# Patient Record
Sex: Female | Born: 1995 | Race: Black or African American | Hispanic: No | Marital: Single | Smoking: Never smoker
Health system: Southern US, Community
[De-identification: ages and names within clinical notes are randomized; demographics above are authoritative.]

## PROBLEM LIST (undated history)

## (undated) DIAGNOSIS — D649 Anemia, unspecified: Secondary | ICD-10-CM

## (undated) DIAGNOSIS — D573 Sickle-cell trait: Secondary | ICD-10-CM

## (undated) HISTORY — PX: NO PAST SURGERIES: SHX2092

---

## 2019-01-14 NOTE — L&D Delivery Note (Signed)
OB/GYN Faculty Practice Delivery Note  Brenda Mayo is a 24 y.o. [name] s/p SVD at [redacted]w[redacted]d. She was admitted for IOL for late term.   ROM: 18h 70m with clear fluid GBS Status:  Negative/-- (07/12 0954) Maximum Maternal Temperature: n/a  Labor Progress: . Initial SVE: 0/Thick/-3. She received Cytotec and was transitioned to Pitocin. She progressed to complete over the course of several hours.   Delivery Date/Time: 8/11 at 15:37  Delivery: Called to room and patient was complete and pushing. Head delivered ROT. No nuchal cord present. Shoulder and body delivered in usual fashion. Infant with spontaneous cry, placed on mother's abdomen, dried and stimulated. Cord clamped x 2 after 1-minute delay, and cut by FOB. Cord blood drawn. Placenta delivered spontaneously with gentle cord traction. Fundus firm with massage and Pitocin. Postpartum Liletta IUD inserted, see separate procedure note for details. Labia, perineum, vagina, and cervix inspected inspected with first degree laceration that was repaired in routine fashion with 3-0 vicryl with good hemostasis.  .  Baby Weight: pending  Placenta: Sent to L&D Complications: none Lacerations: first degree, repaired EBL: 300 mL Analgesia: Epidural    Infant:  APGAR (1 MIN): 8   APGAR (5 MINS): 9   APGAR (10 MINS):     Casper Harrison, MD [facility] Family Medicine Fellow, [facility] for [facility], [facility] 08/24/2019, 4:05 PM

## 2019-02-16 ENCOUNTER — Other Ambulatory Visit: Payer: Self-pay

## 2019-02-16 ENCOUNTER — Inpatient Hospital Stay (HOSPITAL_COMMUNITY)
Admission: EM | Admit: 2019-02-16 | Discharge: 2019-02-16 | Disposition: A | Discharge status: Home / Self Care | Payer: Medicaid Other | Attending: Obstetrics and Gynecology | Admitting: Obstetrics and Gynecology

## 2019-02-16 ENCOUNTER — Inpatient Hospital Stay (HOSPITAL_COMMUNITY): Payer: Medicaid Other

## 2019-02-16 ENCOUNTER — Encounter (HOSPITAL_COMMUNITY): Payer: Self-pay | Admitting: Obstetrics and Gynecology

## 2019-02-16 DIAGNOSIS — O26891 Other specified pregnancy related conditions, first trimester: Secondary | ICD-10-CM

## 2019-02-16 DIAGNOSIS — R1031 Right lower quadrant pain: Secondary | ICD-10-CM | POA: Diagnosis present

## 2019-02-16 DIAGNOSIS — Z880 Allergy status to penicillin: Secondary | ICD-10-CM | POA: Diagnosis not present

## 2019-02-16 DIAGNOSIS — R109 Unspecified abdominal pain: Secondary | ICD-10-CM

## 2019-02-16 DIAGNOSIS — O26899 Other specified pregnancy related conditions, unspecified trimester: Secondary | ICD-10-CM

## 2019-02-16 DIAGNOSIS — Z3A12 12 weeks gestation of pregnancy: Secondary | ICD-10-CM | POA: Diagnosis not present

## 2019-02-16 DIAGNOSIS — Z3491 Encounter for supervision of normal pregnancy, unspecified, first trimester: Secondary | ICD-10-CM

## 2019-02-16 HISTORY — DX: Sickle-cell trait: D57.3

## 2019-02-16 HISTORY — DX: Anemia, unspecified: D64.9

## 2019-02-16 LAB — URINALYSIS, ROUTINE W REFLEX MICROSCOPIC
Bilirubin Urine: NEGATIVE
Glucose, UA: NEGATIVE mg/dL
Hgb urine dipstick: NEGATIVE
Ketones, ur: NEGATIVE mg/dL
Leukocytes,Ua: NEGATIVE
Nitrite: NEGATIVE
Protein, ur: NEGATIVE mg/dL
Specific Gravity, Urine: 1.017 (ref 1.005–1.030)
pH: 6 (ref 5.0–8.0)

## 2019-02-16 LAB — POCT PREGNANCY, URINE: Preg Test, Ur: POSITIVE — AB

## 2019-02-16 NOTE — Discharge Instructions (Signed)
Prenatal Care Providers           Center for [facility] @ [facility]   Phone: [phone]  Center for [facility] @ [facility]   Phone: [phone]  [facility] @[place]       Phone: [phone]            Center for [facility] @ [place]     Phone: [phone]          Center for [facility] @ [place]   Phone: [phone]  Center for [facility] @ [facility]  Phone: [phone]  Center for [facility] @ [facility] Phone: [phone]     [facility]  Phone: [phone]  [facility]  Phone: [phone]  [phone] OB/GYN Phone: [phone]  [facility] Phone: [phone]  [facility] Phone: [phone]  [facility] Phone: [phone]  [facility]  Phone: [phone]    Safe Medications in Pregnancy   Acne: Benzoyl Peroxide Salicylic Acid  Backache/Headache: Tylenol: 2 regular strength every 4 hours OR              2 Extra strength every 6 hours  Colds/Coughs/Allergies: Benadryl (alcohol free) 25 mg every 6 hours as needed Breath right strips Claritin Cepacol throat lozenges Chloraseptic throat spray Cold-Eeze- up to three times per day Cough drops, alcohol free Flonase (by prescription only) Guaifenesin Mucinex Robitussin DM (plain only, alcohol free) Saline nasal spray/drops Sudafed (pseudoephedrine) & Actifed ** use only after [redacted] weeks gestation and if you do not have high blood pressure Tylenol Vicks Vaporub Zinc lozenges Zyrtec   Constipation: Colace Ducolax suppositories Fleet enema Glycerin suppositories Metamucil Milk of magnesia Miralax Senokot Smooth move tea  Diarrhea: Kaopectate Imodium A-D  *NO pepto Bismol  Hemorrhoids: Anusol Anusol HC Preparation H Tucks  Indigestion: Tums Maalox Mylanta Zantac  Pepcid  Insomnia: Benadryl (alcohol free)  25mg  every 6 hours as needed Tylenol PM Unisom, no Gelcaps  Leg Cramps: Tums MagGel  Nausea/Vomiting:  Bonine Dramamine Emetrol Ginger extract Sea bands Meclizine  Nausea medication to take during pregnancy:  Unisom (doxylamine succinate 25 mg tablets) Take one tablet daily at bedtime. If symptoms are not adequately controlled, the dose can be increased to a maximum recommended dose of two tablets daily (1/2 tablet in the morning, 1/2 tablet mid-afternoon and one at bedtime). Vitamin B6 100mg  tablets. Take one tablet twice a day (up to 200 mg per day).  Skin Rashes: Aveeno products Benadryl cream or 25mg  every 6 hours as needed Calamine Lotion 1% cortisone cream  Yeast infection: Gyne-lotrimin 7 Monistat 7  Gum/tooth pain: Anbesol  **If taking multiple medications, please check labels to avoid duplicating the same active ingredients **take medication as directed on the label ** Do not exceed 4000 mg of tylenol in 24 hours **Do not take medications that contain aspirin or ibuprofen     Prenatal Care Prenatal care is health care during pregnancy. It helps you and your unborn baby (fetus) stay as healthy as possible. Prenatal care may be provided by a midwife, a family practice health care provider, or a childbirth and pregnancy specialist (obstetrician). How does this affect me? During pregnancy, you will be closely monitored for any new conditions that might develop. To lower your risk of pregnancy complications, you and your health care provider will talk about any underlying conditions you have. How does this affect my baby? Early and consistent prenatal care increases the chance that your  baby will be healthy during pregnancy. Prenatal care lowers the risk that your baby will be:  Born early (prematurely).  Smaller than expected at birth (small for gestational age). What can I expect at the first prenatal care visit? Your first prenatal care visit will likely be  the longest. You should schedule your first prenatal care visit as soon as you know that you are pregnant. Your first visit is a good time to talk about any questions or concerns you have about pregnancy. At your visit, you and your health care provider will talk about:  Your medical history, including: ? Any past pregnancies. ? Your family's medical history. ? The baby's father's medical history. ? Any long-term (chronic) health conditions you have and how you manage them. ? Any surgeries or procedures you have had. ? Any current over-the-counter or prescription medicines, herbs, or supplements you are taking.  Other factors that could pose a risk to your baby, including:  Your home setting and your stress levels, including: ? Exposure to abuse or violence. ? Household financial strain. ? Mental health conditions you have.  Your daily health habits, including diet and exercise. Your health care provider will also:  Measure your weight, height, and blood pressure.  Do a physical exam, including a pelvic and breast exam.  Perform blood tests and urine tests to check for: ? Urinary tract infection. ? Sexually transmitted infections (STIs). ? Low iron levels in your blood (anemia). ? Blood type and certain proteins on red blood cells (Rh antibodies). ? Infections and immunity to viruses, such as hepatitis B and rubella. ? HIV (human immunodeficiency virus).  Do an ultrasound to confirm your baby's growth and development and to help predict your estimated due date (EDD). This ultrasound is done with a probe that is inserted into the vagina (transvaginal ultrasound).  Discuss your options for genetic screening.  Give you information about how to keep yourself and your baby healthy, including: ? Nutrition and taking vitamins. ? Physical activity. ? How to manage pregnancy symptoms such as nausea and vomiting (morning sickness). ? Infections and substances that may be harmful to your  baby and how to avoid them. ? Food safety. ? Dental care. ? Working. ? Travel. ? Warning signs to watch for and when to call your health care provider. How often will I have prenatal care visits? After your first prenatal care visit, you will have regular visits throughout your pregnancy. The visit schedule is often as follows:  Up to week 28 of pregnancy: once every 4 weeks.  28-36 weeks: once every 2 weeks.  After 36 weeks: every week until delivery. Some women may have visits more or less often depending on any underlying health conditions and the health of the baby. Keep all follow-up and prenatal care visits as told by your health care provider. This is important. What happens during routine prenatal care visits? Your health care provider will:  Measure your weight and blood pressure.  Check for fetal heart sounds.  Measure the height of your uterus in your abdomen (fundal height). This may be measured starting around week 20 of pregnancy.  Check the position of your baby inside your uterus.  Ask questions about your diet, sleeping patterns, and whether you can feel the baby move.  Review warning signs to watch for and signs of labor.  Ask about any pregnancy symptoms you are having and how you are dealing with them. Symptoms may include: ? Headaches. ? Nausea and vomiting. ?  Vaginal discharge. ? Swelling. ? Fatigue. ? Constipation. ? Any discomfort, including back or pelvic pain. Make a list of questions to ask your health care provider at your routine visits. What tests might I have during prenatal care visits? You may have blood, urine, and imaging tests throughout your pregnancy, such as:  Urine tests to check for glucose, protein, or signs of infection.  Glucose tests to check for a form of diabetes that can develop during pregnancy (gestational diabetes mellitus). This is usually done around week 24 of pregnancy.  An ultrasound to check your baby's growth and  development and to check for birth defects. This is usually done around week 20 of pregnancy.  A test to check for group B strep (GBS) infection. This is usually done around week 36 of pregnancy.  Genetic testing. This may include blood or imaging tests, such as an ultrasound. Some genetic tests are done during the first trimester and some are done during the second trimester. What else can I expect during prenatal care visits? Your health care provider may recommend getting certain vaccines during pregnancy. These may include:  A yearly flu shot (annual influenza vaccine). This is especially important if you will be pregnant during flu season.  Tdap (tetanus, diphtheria, pertussis) vaccine. Getting this vaccine during pregnancy can protect your baby from whooping cough (pertussis) after birth. This vaccine may be recommended between weeks 27 and 36 of pregnancy. Later in your pregnancy, your health care provider may give you information about:  Childbirth and breastfeeding classes.  Choosing a health care provider for your baby.  Umbilical cord banking.  Breastfeeding.  Birth control after your baby is born.  The hospital labor and delivery unit and how to tour it.  Registering at the hospital before you go into labor. Where to find more information  Office on Women's Health: TravelLesson.ca  American Pregnancy Association: americanpregnancy.org  March of Dimes: marchofdimes.org Summary  Prenatal care helps you and your baby stay as healthy as possible during pregnancy.  Your first prenatal care visit will most likely be the longest.  You will have visits and tests throughout your pregnancy to monitor your health and your baby's health.  Bring a list of questions to your visits to ask your health care provider.  Make sure to keep all follow-up and prenatal care visits with your health care provider. This information is not intended to replace advice given to you by your  health care provider. Make sure you discuss any questions you have with your health care provider. Document Revised: 04/21/2018 Document Reviewed: 12/29/2016 Elsevier Patient Education  2020 [employer].

## 2019-02-16 NOTE — ED Notes (Signed)
Patient transferred to MAU by EMT. Ala Dach, [place] assessed patient. Report given to MAU charge.

## 2019-02-16 NOTE — MAU Note (Signed)
Pt presents to MAU with c/o abdominal cramping that started last night. She denies VB. She has had +HPT on 12/15/2018. LMP-11/23/2018

## 2019-02-16 NOTE — ED Provider Notes (Signed)
MSE was initiated and I personally evaluated the patient and placed orders (if any) at  2:00PM on February 16, 2019.  Brenda Mayo is a 24 y.o. female who is approximately [redacted] weeks pregnant, history of 1 prior miscarriage, who presents today with lower abdominal cramping that started last night.  She reports cramping is mild-moderate in intensity.  She denies any associated vaginal bleeding or leakage of fluids.  She had a positive home pregnancy test on 12/15/2018, and last menstrual period was on 11/23/2018.  She is waiting on insurance and has not yet seen an OB/GYN provider during this pregnancy, she has not had an ultrasound yet to confirm intrauterine pregnancy.  Patient denies any other medical complaints.  The patient appears stable so that the remainder of the MSE may be completed by another provider.  I called and notified provider in the MAU and nursing staff will transport patient to the MAU for further evaluation.   Dartha Lodge, PA-C 02/16/19 1540    Gerhard Munch, MD 02/16/19 [phone]

## 2019-02-16 NOTE — H&P (Signed)
Chief Complaint: Abdominal Pain   First Provider Initiated Contact with Patient 02/16/19 1452     SUBJECTIVE HPI: Brenda Mayo is a 24 y.o. [name] at [redacted]w[redacted]d who presents to maternity Admissions reporting cramping in her right lower abdominal/pelvic area. The discomfort started yesterday at around 8:30 pm after she bent over to pick up her dog. She immediately felt the pain which resolved a few hours later after resting. She has not had any discomfort today.  Her last day of last menstrual period was on Nov 10. Her first positive pregnancy test was on Dec 8 and the second on Dec 15. Does not follow an outpatient provider and would like to get some recommendations as to where she can establish care. She had a  miscarriage ~3 years ago when she was [redacted] weeks pregnant. In that instance the event started with a similar abdominal discomfort that progressed to intense, generalized abdominal pain.   Location: Right lower abdominal/pelvic Quality: Tension Severity: 6/10 on pain scale Duration: 3 hrs  Timing: 2/2 8:30 pm Modifying factors: Rest made it better Associated signs and symptoms: None  Past Medical History:  Diagnosis Date  . Anemia   . Sickle cell trait (HCC)    OB History  Gravida Para Term Preterm AB Living  2       1    SAB TAB Ectopic Multiple Live Births  1            # Outcome Date GA Lbr Len/2nd Weight Sex Delivery Anes PTL Lv  2 Current           1 SAB 2017           Past Surgical History:  Procedure Laterality Date  . NO PAST SURGERIES     Social History   Socioeconomic History  . Marital status: Single    Spouse name: Not on file  . Number of children: Not on file  . Years of education: Not on file  . Highest education level: Not on file  Occupational History  . Not on file  Tobacco Use  . Smoking status: Never Smoker  . Smokeless tobacco: Never Used  Substance and Sexual Activity  . Alcohol use: Never  . Drug use: Never  . Sexual activity: Yes  Other  Topics Concern  . Not on file  Social History Narrative  . Not on file   Social Determinants of Health   Financial Resource Strain:   . Difficulty of Paying Living Expenses: Not on file  Food Insecurity:   . Worried About Programme researcher, broadcasting/film/video in the Last Year: Not on file  . Ran Out of Food in the Last Year: Not on file  Transportation Needs:   . Lack of Transportation (Medical): Not on file  . Lack of Transportation (Non-Medical): Not on file  Physical Activity:   . Days of Exercise per Week: Not on file  . Minutes of Exercise per Session: Not on file  Stress:   . Feeling of Stress : Not on file  Social Connections:   . Frequency of Communication with Friends and Family: Not on file  . Frequency of Social Gatherings with Friends and Family: Not on file  . Attends Religious Services: Not on file  . Active Member of Clubs or Organizations: Not on file  . Attends Banker Meetings: Not on file  . Marital Status: Not on file  Intimate Partner Violence:   . Fear of Current or Ex-Partner: Not on  file  . Emotionally Abused: Not on file  . Physically Abused: Not on file  . Sexually Abused: Not on file   History reviewed. No pertinent family history. No current facility-administered medications on file prior to encounter.   Current Outpatient Medications on File Prior to Encounter  Medication Sig Dispense Refill  . prenatal vitamin w/FE, FA (PRENATAL 1 + 1) 27-1 MG TABS tablet Take 1 tablet by mouth daily at 12 noon.     Allergies  Allergen Reactions  . Penicillins     Reports as child, does not know the reaction type.     I have reviewed patient's Past Medical Hx, Surgical Hx, Family Hx, Social Hx, medications and allergies.   Review of Systems  Constitutional: Negative for fever.  Gastrointestinal: Negative for diarrhea, nausea and vomiting.  Genitourinary: Negative for difficulty urinating, dyspareunia, dysuria, frequency, hematuria and vaginal discharge.     OBJECTIVE Patient Vitals for the past 24 hrs:  BP Temp Temp src Pulse Resp SpO2 Height Weight  02/16/19 1418 -- -- -- -- -- -- 5\' 10"  (1.778 m) 71.2 kg  02/16/19 1416 135/74 98.6 F (37 C) Oral 82 18 100 % -- --  02/16/19 1352 122/85 98 F (36.7 C) -- 99 13 100 % -- --  02/16/19 1345 122/85 98 F (36.7 C) Oral 99 13 100 % -- --   Constitutional: Well-developed, well-nourished female in no acute distress.  Cardiovascular: normal rate & rhythm, no murmur Respiratory: normal rate and effort. Lung sounds clear throughout GI: Abd soft, non-tender, Pos BS x 4. No guarding or rebound tenderness MS: Extremities nontender, no edema, normal ROM Neurologic: Alert and oriented x 4.    LAB RESULTS Results for orders placed or performed during the hospital encounter of 02/16/19 (from the past 24 hour(s))  Pregnancy, urine POC     Status: Abnormal   Collection Time: 02/16/19  2:15 PM  Result Value Ref Range   Preg Test, Ur POSITIVE (A) NEGATIVE  Urinalysis, Routine w reflex microscopic     Status: Abnormal   Collection Time: 02/16/19  2:20 PM  Result Value Ref Range   Color, Urine YELLOW YELLOW   APPearance HAZY (A) CLEAR   Specific Gravity, Urine 1.017 1.005 - 1.030   pH 6.0 5.0 - 8.0   Glucose, UA NEGATIVE NEGATIVE mg/dL   Hgb urine dipstick NEGATIVE NEGATIVE   Bilirubin Urine NEGATIVE NEGATIVE   Ketones, ur NEGATIVE NEGATIVE mg/dL   Protein, ur NEGATIVE NEGATIVE mg/dL   Nitrite NEGATIVE NEGATIVE   Leukocytes,Ua NEGATIVE NEGATIVE    IMAGING No results found.  MAU COURSE Orders Placed This Encounter  Procedures  . US OB Comp Less 14 Wks  . Urinalysis, Routine w reflex microscopic  . Pregnancy, urine POC   No orders of the defined types were placed in this encounter.   MDM  ASSESSMENT 24 yo P2G0010 who present for evaluation of a now resolved right lower abdominal/pelvic cramping after lifting a heavy object. Given that the pain resolved last night and she has no other  worrisome symptoms such vaginal discharge, vaginal bleeding, fever, dyspareunia, or dysuria, we think is safe for the patient to be discharged.  1. Normal IUP (intrauterine pregnancy) on prenatal ultrasound, first trimester   2. [redacted] weeks gestation of pregnancy   3. Abdominal cramping affecting pregnancy     PLAN Discharge home in stable condition. Please return to the ED if she experiences vaginal bleeding, fever or intense abdominal pain that is not relieved  with rest.     Manuela Neptune, Medical Student 02/16/2019  3:17 PM   I confirm that I have verified the information documented in the medical student's note and that I have also personally performed the history, physical exam and all medical decision making activities of this service and have verified that all service and findings are accurately documented in this student's note.   RN unable to doppler FHTs. Ultrasound ordered through MAU & shows live IUP with EDD c/w LMP.  Patient asymptomatic at this time.  U/a negative for infection.  Given list of ob/gyn providers - pt currently lives in [place] but will be moving to [place] this summer. Discussed [facility] where she would like to get prenatal care.  Reviewed reasons to return to MAU.   Judeth Horn, NP 02/16/2019 5:08 PM

## 2019-03-16 ENCOUNTER — Encounter: Payer: Medicaid Other | Admitting: Family Medicine

## 2019-04-11 ENCOUNTER — Encounter: Payer: Medicaid Other | Admitting: Obstetrics & Gynecology

## 2019-04-19 ENCOUNTER — Ambulatory Visit (INDEPENDENT_AMBULATORY_CARE_PROVIDER_SITE_OTHER): Payer: Medicaid Other | Admitting: Advanced Practice Midwife

## 2019-04-19 ENCOUNTER — Encounter: Payer: Self-pay | Admitting: Advanced Practice Midwife

## 2019-04-19 ENCOUNTER — Other Ambulatory Visit (HOSPITAL_COMMUNITY)
Admission: RE | Admit: 2019-04-19 | Discharge: 2019-04-19 | Disposition: A | Discharge status: Home / Self Care | Payer: Medicaid Other | Source: Ambulatory Visit | Attending: Family Medicine | Admitting: Family Medicine

## 2019-04-19 ENCOUNTER — Other Ambulatory Visit: Payer: Self-pay

## 2019-04-19 VITALS — BP 112/67 | HR 100 | Wt 194.1 lb

## 2019-04-19 DIAGNOSIS — Z3480 Encounter for supervision of other normal pregnancy, unspecified trimester: Secondary | ICD-10-CM

## 2019-04-19 DIAGNOSIS — D573 Sickle-cell trait: Secondary | ICD-10-CM

## 2019-04-19 NOTE — Progress Notes (Signed)
hep

## 2019-04-19 NOTE — Progress Notes (Signed)
  Subjective:    Brenda Mayo is a [name] [redacted]w[redacted]d being seen today for her first obstetrical visit.  Her obstetrical history is significant for sickle cell trait. Patient does intend to breast feed. Pregnancy history fully reviewed.  Patient reports no complaints.  Has had episodes of swelling which resolved quickly.  Though she had preeclampsia so bought a cuff.  FOB has not been tested "but doesn't have it in his family".  Explained we recommend he be tested.  Vitals:   04/19/19 0802  BP: 112/67  Pulse: 100  Weight: 194 lb 1.9 oz (88.1 kg)    HISTORY: OB History  Gravida Para Term Preterm AB Living  2       1    SAB TAB Ectopic Multiple Live Births  1            # Outcome Date GA Lbr Len/2nd Weight Sex Delivery Anes PTL Lv  2 Current           1 SAB 2017           Past Medical History:  Diagnosis Date  . Anemia   . Sickle cell trait [facility])    Past Surgical History:  Procedure Laterality Date  . NO PAST SURGERIES     Family History  Problem Relation Age of Onset  . Sickle cell trait Mother      Exam    Uterus:     Pelvic Exam:    Perineum: Normal Perineum   Vulva: Bartholin's, Urethra, Skene's normal   Vagina:  normal mucosa, normal discharge   pH:    Cervix: no bleeding following Pap and no cervical motion tenderness   Adnexa: normal adnexa and no mass, fullness, tenderness   Bony Pelvis: gynecoid  System: Breast:  normal appearance, no masses or tenderness   Skin: normal coloration and turgor, no rashes    Neurologic: oriented, grossly non-focal   Extremities: normal strength, tone, and muscle mass   HEENT neck supple with midline trachea   Mouth/Teeth mucous membranes moist, pharynx normal without lesions   Neck supple and no masses   Cardiovascular: regular rate and rhythm   Respiratory:  appears well, vitals normal, no respiratory distress, acyanotic, normal RR, ear and throat exam is normal, neck free of mass or lymphadenopathy, chest clear, no  wheezing, crepitations, rhonchi, normal symmetric air entry   Abdomen: soft, non-tender; bowel sounds normal; no masses,  no organomegaly   Urinary: urethral meatus normal      Assessment:    Pregnancy: [name] Patient Active Problem List   Diagnosis Date Noted  . Supervision of other normal pregnancy, antepartum 04/19/2019        Plan:     Problem list reviewed and updated.  Follow up in 6 weeks for Glucola 50% of 30 min visit spent on counseling and coordination of care.   Initial labs drawn. Continue prenatal vitamins. Discussed and offered genetic screening options, including Quad screen/AFP, NIPS testing, and option to decline testing. Benefits/risks/alternatives reviewed. Pt aware that anatomy US is form of genetic screening with lower accuracy in detecting trisomies than blood work.  Pt chooses genetic screening today. NIPS: ordered. Ultrasound discussed; fetal anatomic survey: ordered. Problem list reviewed and updated. The nature of [facility] with multiple MDs and other Advanced Practice Providers was explained to patient; also emphasized that residents, students are part of our team. Routine obstetric precautions reviewed.    Wynelle Bourgeois 04/19/2019

## 2019-04-19 NOTE — Patient Instructions (Signed)

## 2019-04-20 LAB — OBSTETRIC PANEL, INCLUDING HIV
Antibody Screen: NEGATIVE
Basophils Absolute: 0.1 10*3/uL (ref 0.0–0.2)
Basos: 1 %
EOS (ABSOLUTE): 0.1 10*3/uL (ref 0.0–0.4)
Eos: 1 %
HIV Screen 4th Generation wRfx: NONREACTIVE
Hematocrit: 36.2 % (ref 34.0–46.6)
Hemoglobin: 11.7 g/dL (ref 11.1–15.9)
Hepatitis B Surface Ag: NEGATIVE
Immature Grans (Abs): 0.3 10*3/uL — ABNORMAL HIGH (ref 0.0–0.1)
Immature Granulocytes: 2 %
Lymphocytes Absolute: 2.3 10*3/uL (ref 0.7–3.1)
Lymphs: 17 %
MCH: 31.1 pg (ref 26.6–33.0)
MCHC: 32.3 g/dL (ref 31.5–35.7)
MCV: 96 fL (ref 79–97)
Monocytes Absolute: 1 10*3/uL — ABNORMAL HIGH (ref 0.1–0.9)
Monocytes: 8 %
Neutrophils Absolute: 9.6 10*3/uL — ABNORMAL HIGH (ref 1.4–7.0)
Neutrophils: 71 %
Platelets: 214 10*3/uL (ref 150–450)
RBC: 3.76 x10E6/uL — ABNORMAL LOW (ref 3.77–5.28)
RDW: 11.8 % (ref 11.7–15.4)
RPR Ser Ql: NONREACTIVE
Rh Factor: POSITIVE
Rubella Antibodies, IGG: 7.44 index (ref >0.99)
WBC: 13.3 10*3/uL — ABNORMAL HIGH (ref 3.4–10.8)

## 2019-04-20 LAB — HEPATITIS C ANTIBODY: Hep C Virus Ab: <0.1 s/co ratio (ref 0.0–0.9)

## 2019-04-21 LAB — CYTOLOGY - PAP
Chlamydia: NEGATIVE
Comment: NEGATIVE
Comment: NORMAL
Diagnosis: NEGATIVE
Neisseria Gonorrhea: NEGATIVE

## 2019-04-21 LAB — URINE CULTURE, OB REFLEX

## 2019-04-21 LAB — CULTURE, OB URINE

## 2019-04-22 LAB — HEMOGLOBPATHY+FER W/A THAL RFX
Ferritin: 23 ng/mL (ref 15–150)
Hematocrit: 36.2 % (ref 34.0–46.6)
Hemoglobin: 11.6 g/dL (ref 11.1–15.9)
Hgb A2: 3.2 % (ref 1.8–3.2)
Hgb A: 55.3 % — ABNORMAL LOW (ref 96.4–98.8)
Hgb F: 0.5 % (ref 0.0–2.0)
Hgb S: 41 % — ABNORMAL HIGH
MCH: 31.1 pg (ref 26.6–33.0)
MCHC: 32 g/dL (ref 31.5–35.7)
MCV: 97 fL (ref 79–97)
Platelets: 213 10*3/uL (ref 150–450)
RBC: 3.73 x10E6/uL — ABNORMAL LOW (ref 3.77–5.28)
RDW: 11.8 % (ref 11.7–15.4)
WBC: 12.8 10*3/uL — ABNORMAL HIGH (ref 3.4–10.8)

## 2019-04-22 LAB — HGB SOLUBILITY: Hgb Solubility: POSITIVE — AB

## 2019-04-25 ENCOUNTER — Other Ambulatory Visit: Payer: Self-pay

## 2019-04-25 NOTE — Progress Notes (Signed)
Patient made aware of Panorama results. Patient also made aware that fetal sex is female. [name] RN

## 2019-04-26 ENCOUNTER — Encounter: Payer: Self-pay | Admitting: Advanced Practice Midwife

## 2019-04-26 DIAGNOSIS — D573 Sickle-cell trait: Secondary | ICD-10-CM | POA: Insufficient documentation

## 2019-04-26 DIAGNOSIS — O99019 Anemia complicating pregnancy, unspecified trimester: Secondary | ICD-10-CM | POA: Insufficient documentation

## 2019-04-27 ENCOUNTER — Other Ambulatory Visit: Payer: Self-pay

## 2019-04-27 ENCOUNTER — Telehealth: Payer: Self-pay

## 2019-04-27 ENCOUNTER — Ambulatory Visit (HOSPITAL_COMMUNITY)
Admission: RE | Admit: 2019-04-27 | Discharge: 2019-04-27 | Disposition: A | Discharge status: Home / Self Care | Payer: Medicaid Other | Source: Ambulatory Visit | Attending: Advanced Practice Midwife | Admitting: Advanced Practice Midwife

## 2019-04-27 ENCOUNTER — Other Ambulatory Visit (HOSPITAL_COMMUNITY): Payer: Self-pay | Admitting: *Deleted

## 2019-04-27 DIAGNOSIS — Z3A23 23 weeks gestation of pregnancy: Secondary | ICD-10-CM | POA: Diagnosis not present

## 2019-04-27 DIAGNOSIS — Z3480 Encounter for supervision of other normal pregnancy, unspecified trimester: Secondary | ICD-10-CM

## 2019-04-27 DIAGNOSIS — Z363 Encounter for antenatal screening for malformations: Secondary | ICD-10-CM

## 2019-04-27 DIAGNOSIS — Z362 Encounter for other antenatal screening follow-up: Secondary | ICD-10-CM

## 2019-04-27 NOTE — Telephone Encounter (Signed)
Called patient and offered to let her come in for AFP only. Patient states she is unable to come in this week (she is 23.1 wks today) for the blood draw. Patient declines AFP. [name] RN

## 2019-05-02 ENCOUNTER — Other Ambulatory Visit: Payer: Self-pay

## 2019-05-27 ENCOUNTER — Other Ambulatory Visit: Payer: Self-pay

## 2019-05-27 ENCOUNTER — Ambulatory Visit (HOSPITAL_COMMUNITY): Payer: Medicaid Other | Attending: Obstetrics and Gynecology

## 2019-05-27 DIAGNOSIS — Z362 Encounter for other antenatal screening follow-up: Secondary | ICD-10-CM | POA: Insufficient documentation

## 2019-05-27 DIAGNOSIS — Z3A27 27 weeks gestation of pregnancy: Secondary | ICD-10-CM

## 2019-05-27 DIAGNOSIS — Z363 Encounter for antenatal screening for malformations: Secondary | ICD-10-CM | POA: Diagnosis not present

## 2019-05-31 ENCOUNTER — Ambulatory Visit (INDEPENDENT_AMBULATORY_CARE_PROVIDER_SITE_OTHER): Payer: Medicaid Other | Admitting: Advanced Practice Midwife

## 2019-05-31 ENCOUNTER — Encounter: Payer: Self-pay | Admitting: Advanced Practice Midwife

## 2019-05-31 ENCOUNTER — Other Ambulatory Visit: Payer: Self-pay

## 2019-05-31 DIAGNOSIS — Z3480 Encounter for supervision of other normal pregnancy, unspecified trimester: Secondary | ICD-10-CM

## 2019-05-31 NOTE — Patient Instructions (Signed)
Glucose Tolerance Test During Pregnancy Why am I having this test? The glucose tolerance test (GTT) is done to check how your body processes sugar (glucose). This is one of several tests used to diagnose diabetes that develops during pregnancy (gestational diabetes mellitus). Gestational diabetes is a temporary form of diabetes that some women develop during pregnancy. It usually occurs during the second trimester of pregnancy and goes away after delivery. Testing (screening) for gestational diabetes usually occurs between 24 and 28 weeks of pregnancy. You may have the GTT test after having a 1-hour glucose screening test if the results from that test indicate that you may have gestational diabetes. You may also have this test if:  You have a history of gestational diabetes.  You have a history of giving birth to very large babies or have experienced repeated fetal loss (stillbirth).  You have signs and symptoms of diabetes, such as: ? Changes in your vision. ? Tingling or numbness in your hands or feet. ? Changes in hunger, thirst, and urination that are not otherwise explained by your pregnancy. What is being tested? This test measures the amount of glucose in your blood at different times during a period of 3 hours. This indicates how well your body is able to process glucose. What kind of sample is taken?  Blood samples are required for this test. They are usually collected by inserting a needle into a blood vessel. How do I prepare for this test?  For 3 days before your test, eat normally. Have plenty of carbohydrate-rich foods.  Follow instructions from your health care provider about: ? Eating or drinking restrictions on the day of the test. You may be asked to not eat or drink anything other than water (fast) starting 8-10 hours before the test. ? Changing or stopping your regular medicines. Some medicines may interfere with this test. Tell a health care provider about:  All  medicines you are taking, including vitamins, herbs, eye drops, creams, and over-the-counter medicines.  Any blood disorders you have.  Any surgeries you have had.  Any medical conditions you have. What happens during the test? First, your blood glucose will be measured. This is referred to as your fasting blood glucose, since you fasted before the test. Then, you will drink a glucose solution that contains a certain amount of glucose. Your blood glucose will be measured again 1, 2, and 3 hours after drinking the solution. This test takes about 3 hours to complete. You will need to stay at the testing location during this time. During the testing period:  Do not eat or drink anything other than the glucose solution.  Do not exercise.  Do not use any products that contain nicotine or tobacco, such as cigarettes and e-cigarettes. If you need help stopping, ask your health care provider. The testing procedure may vary among health care providers and hospitals. How are the results reported? Your results will be reported as milligrams of glucose per deciliter of blood (mg/dL) or millimoles per liter (mmol/L). Your health care provider will compare your results to normal ranges that were established after testing a large group of people (reference ranges). Reference ranges may vary among labs and hospitals. For this test, common reference ranges are:  Fasting: less than 95-105 mg/dL (5.3-5.8 mmol/L).  1 hour after drinking glucose: less than 180-190 mg/dL (10.0-10.5 mmol/L).  2 hours after drinking glucose: less than 155-165 mg/dL (8.6-9.2 mmol/L).  3 hours after drinking glucose: 140-145 mg/dL (7.8-8.1 mmol/L). What do the   results mean? Results within reference ranges are considered normal, meaning that your glucose levels are well-controlled. If two or more of your blood glucose levels are high, you may be diagnosed with gestational diabetes. If only one level is high, your health care  provider may suggest repeat testing or other tests to confirm a diagnosis. Talk with your health care provider about what your results mean. Questions to ask your health care provider Ask your health care provider, or the department that is doing the test:  When will my results be ready?  How will I get my results?  What are my treatment options?  What other tests do I need?  What are my next steps? Summary  The glucose tolerance test (GTT) is one of several tests used to diagnose diabetes that develops during pregnancy (gestational diabetes mellitus). Gestational diabetes is a temporary form of diabetes that some women develop during pregnancy.  You may have the GTT test after having a 1-hour glucose screening test if the results from that test indicate that you may have gestational diabetes. You may also have this test if you have any symptoms or risk factors for gestational diabetes.  Talk with your health care provider about what your results mean. This information is not intended to replace advice given to you by your health care provider. Make sure you discuss any questions you have with your health care provider. Document Revised: 04/22/2018 Document Reviewed: 08/11/2016 Elsevier Patient Education  2020 [employer]  

## 2019-05-31 NOTE — Progress Notes (Signed)
Patient ate oatmeal cake this morning and is unable to 28 week lab work. Patient scheduled to return Monday morning fasting for labwork. [name] RN

## 2019-05-31 NOTE — Progress Notes (Signed)
   PRENATAL VISIT NOTE  Subjective:  Brenda Mayo is a 24 y.o. [name] at [redacted]w[redacted]d being seen today for ongoing prenatal care.  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  Patient reports no complaints.  Contractions: Not present. Vag. Bleeding: None.  Movement: Present. Denies leaking of fluid.   The following portions of the patient's history were reviewed and updated as appropriate: allergies, current medications, past family history, past medical history, past social history, past surgical history and problem list.   Objective:   Vitals:   05/31/19 0820  BP: 121/66  Pulse: 97  Weight: 224 lb (101.6 kg)    Fetal Status: Fetal Heart Rate (bpm): 140   Movement: Present     General:  Alert, oriented and cooperative. Patient is in no acute distress.  Skin: Skin is warm and dry. No rash noted.   Cardiovascular: Normal heart rate noted  Respiratory: Normal respiratory effort, no problems with respiration noted  Abdomen: Soft, gravid, appropriate for gestational age.  Pain/Pressure: Absent     Pelvic: Cervical exam deferred        Extremities: Normal range of motion.  Edema: Trace  Mental Status: Normal mood and affect. Normal behavior. Normal judgment and thought content.   Assessment and Plan:  Pregnancy: [name] at [redacted]w[redacted]d 1. Supervision of other normal pregnancy, antepartum      Questions answered re:  glucola testing, round ligament pain, what contractions feel like  Preterm labor symptoms and general obstetric precautions including but not limited to vaginal bleeding, contractions, leaking of fluid and fetal movement were reviewed in detail with the patient. Please refer to After Visit Summary for other counseling recommendations.   Return in about 4 weeks (around 06/28/2019) for [facility]  Future Appointments  Date Time Provider Department Center  06/06/2019  8:30 AM [facility] NURSE [facility] None  06/27/2019  8:15 AM Willodean Rosenthal, MD [facility] None    Wynelle Bourgeois, CNM

## 2019-06-06 ENCOUNTER — Other Ambulatory Visit: Payer: Self-pay

## 2019-06-06 ENCOUNTER — Ambulatory Visit: Payer: Medicaid Other

## 2019-06-06 DIAGNOSIS — Z3480 Encounter for supervision of other normal pregnancy, unspecified trimester: Secondary | ICD-10-CM

## 2019-06-06 NOTE — Progress Notes (Signed)
Patient sent to lab for 28 weeks labs. [name] RN

## 2019-06-07 LAB — CBC
Hematocrit: 34 % (ref 34.0–46.6)
Hemoglobin: 10.9 g/dL — ABNORMAL LOW (ref 11.1–15.9)
MCH: 29.5 pg (ref 26.6–33.0)
MCHC: 32.1 g/dL (ref 31.5–35.7)
MCV: 92 fL (ref 79–97)
Platelets: 226 10*3/uL (ref 150–450)
RBC: 3.7 x10E6/uL — ABNORMAL LOW (ref 3.77–5.28)
RDW: 12.6 % (ref 11.7–15.4)
WBC: 13.8 10*3/uL — ABNORMAL HIGH (ref 3.4–10.8)

## 2019-06-07 LAB — GLUCOSE TOLERANCE, 2 HOURS W/ 1HR
Glucose, 1 hour: 102 mg/dL (ref 65–179)
Glucose, 2 hour: 86 mg/dL (ref 65–152)
Glucose, Fasting: 83 mg/dL (ref 65–91)

## 2019-06-07 LAB — HIV ANTIBODY (ROUTINE TESTING W REFLEX): HIV Screen 4th Generation wRfx: NONREACTIVE

## 2019-06-07 LAB — RPR: RPR Ser Ql: NONREACTIVE

## 2019-06-27 ENCOUNTER — Ambulatory Visit (INDEPENDENT_AMBULATORY_CARE_PROVIDER_SITE_OTHER): Payer: Medicaid Other | Admitting: Obstetrics & Gynecology

## 2019-06-27 ENCOUNTER — Other Ambulatory Visit: Payer: Self-pay

## 2019-06-27 VITALS — BP 113/69 | HR 94 | Wt 233.0 lb

## 2019-06-27 DIAGNOSIS — Z3A31 31 weeks gestation of pregnancy: Secondary | ICD-10-CM

## 2019-06-27 DIAGNOSIS — Z3480 Encounter for supervision of other normal pregnancy, unspecified trimester: Secondary | ICD-10-CM

## 2019-06-27 DIAGNOSIS — O99019 Anemia complicating pregnancy, unspecified trimester: Secondary | ICD-10-CM

## 2019-06-27 DIAGNOSIS — D573 Sickle-cell trait: Secondary | ICD-10-CM

## 2019-06-27 NOTE — Patient Instructions (Signed)
Third Trimester of Pregnancy The third trimester is from week 28 through week 40 (months 7 through 9). The third trimester is a time when the unborn baby (fetus) is growing rapidly. At the end of the ninth month, the fetus is about 20 inches in length and weighs 6-10 pounds. Body changes during your third trimester Your body will continue to go through many changes during pregnancy. The changes vary from woman to woman. During the third trimester:  Your weight will continue to increase. You can expect to gain 25-35 pounds (11-16 kg) by the end of the pregnancy.  You may begin to get stretch marks on your hips, abdomen, and breasts.  You may urinate more often because the fetus is moving lower into your pelvis and pressing on your bladder.  You may develop or continue to have heartburn. This is caused by increased hormones that slow down muscles in the digestive tract.  You may develop or continue to have constipation because increased hormones slow digestion and cause the muscles that push waste through your intestines to relax.  You may develop hemorrhoids. These are swollen veins (varicose veins) in the rectum that can itch or be painful.  You may develop swollen, bulging veins (varicose veins) in your legs.  You may have increased body aches in the pelvis, back, or thighs. This is due to weight gain and increased hormones that are relaxing your joints.  You may have changes in your hair. These can include thickening of your hair, rapid growth, and changes in texture. Some women also have hair loss during or after pregnancy, or hair that feels dry or thin. Your hair will most likely return to normal after your baby is born.  Your breasts will continue to grow and they will continue to become tender. A yellow fluid (colostrum) may leak from your breasts. This is the first milk you are producing for your baby.  Your belly button may stick out.  You may notice more swelling in your hands,  face, or ankles.  You may have increased tingling or numbness in your hands, arms, and legs. The skin on your belly may also feel numb.  You may feel short of breath because of your expanding uterus.  You may have more problems sleeping. This can be caused by the size of your belly, increased need to urinate, and an increase in your body's metabolism.  You may notice the fetus "dropping," or moving lower in your abdomen (lightening).  You may have increased vaginal discharge.  You may notice your joints feel loose and you may have pain around your pelvic bone. What to expect at prenatal visits You will have prenatal exams every 2 weeks until week 36. Then you will have weekly prenatal exams. During a routine prenatal visit:  You will be weighed to make sure you and the baby are growing normally.  Your blood pressure will be taken.  Your abdomen will be measured to track your baby's growth.  The fetal heartbeat will be listened to.  Any test results from the previous visit will be discussed.  You may have a cervical check near your due date to see if your cervix has softened or thinned (effaced).  You will be tested for Group B streptococcus. This happens between 35 and 37 weeks. Your health care provider may ask you:  What your birth plan is.  How you are feeling.  If you are feeling the baby move.  If you have had any abnormal   symptoms, such as leaking fluid, bleeding, severe headaches, or abdominal cramping.  If you are using any tobacco products, including cigarettes, chewing tobacco, and electronic cigarettes.  If you have any questions. Other tests or screenings that may be performed during your third trimester include:  Blood tests that check for low iron levels (anemia).  Fetal testing to check the health, activity level, and growth of the fetus. Testing is done if you have certain medical conditions or if there are problems during the pregnancy.  Nonstress test  (NST). This test checks the health of your baby to make sure there are no signs of problems, such as the baby not getting enough oxygen. During this test, a belt is placed around your belly. The baby is made to move, and its heart rate is monitored during movement. What is false labor? False labor is a condition in which you feel small, irregular tightenings of the muscles in the womb (contractions) that usually go away with rest, changing position, or drinking water. These are called Braxton Hicks contractions. Contractions may last for hours, days, or even weeks before true labor sets in. If contractions come at regular intervals, become more frequent, increase in intensity, or become painful, you should see your health care provider. What are the signs of labor?  Abdominal cramps.  Regular contractions that start at 10 minutes apart and become stronger and more frequent with time.  Contractions that start on the top of the uterus and spread down to the lower abdomen and back.  Increased pelvic pressure and dull back pain.  A watery or bloody mucus discharge that comes from the vagina.  Leaking of amniotic fluid. This is also known as your "water breaking." It could be a slow trickle or a gush. Let your health care provider know if it has a color or strange odor. If you have any of these signs, call your health care provider right away, even if it is before your due date. Follow these instructions at home: Medicines  Follow your health care provider's instructions regarding medicine use. Specific medicines may be either safe or unsafe to take during pregnancy.  Take a prenatal vitamin that contains at least 600 micrograms (mcg) of folic acid.  If you develop constipation, try taking a stool softener if your health care provider approves. Eating and drinking   Eat a balanced diet that includes fresh fruits and vegetables, whole grains, good sources of protein such as meat, eggs, or tofu,  and low-fat dairy. Your health care provider will help you determine the amount of weight gain that is right for you.  Avoid raw meat and uncooked cheese. These carry germs that can cause birth defects in the baby.  If you have low calcium intake from food, talk to your health care provider about whether you should take a daily calcium supplement.  Eat four or five small meals rather than three large meals a day.  Limit foods that are high in fat and processed sugars, such as fried and sweet foods.  To prevent constipation: ? Drink enough fluid to keep your urine clear or pale yellow. ? Eat foods that are high in fiber, such as fresh fruits and vegetables, whole grains, and beans. Activity  Exercise only as directed by your health care provider. Most women can continue their usual exercise routine during pregnancy. Try to exercise for 30 minutes at least 5 days a week. Stop exercising if you experience uterine contractions.  Avoid heavy lifting.  Do   not exercise in extreme heat or humidity, or at high altitudes.  Wear low-heel, comfortable shoes.  Practice good posture.  You may continue to have sex unless your health care provider tells you otherwise. Relieving pain and discomfort  Take frequent breaks and rest with your legs elevated if you have leg cramps or low back pain.  Take warm sitz baths to soothe any pain or discomfort caused by hemorrhoids. Use hemorrhoid cream if your health care provider approves.  Wear a good support bra to prevent discomfort from breast tenderness.  If you develop varicose veins: ? Wear support pantyhose or compression stockings as told by your healthcare provider. ? Elevate your feet for 15 minutes, 3-4 times a day. Prenatal care  Write down your questions. Take them to your prenatal visits.  Keep all your prenatal visits as told by your health care provider. This is important. Safety  Wear your seat belt at all times when driving.  Make  a list of emergency phone numbers, including numbers for family, friends, the hospital, and police and fire departments. General instructions  Avoid cat litter boxes and soil used by cats. These carry germs that can cause birth defects in the baby. If you have a cat, ask someone to clean the litter box for you.  Do not travel far distances unless it is absolutely necessary and only with the approval of your health care provider.  Do not use hot tubs, steam rooms, or saunas.  Do not drink alcohol.  Do not use any products that contain nicotine or tobacco, such as cigarettes and e-cigarettes. If you need help quitting, ask your health care provider.  Do not use any medicinal herbs or unprescribed drugs. These chemicals affect the formation and growth of the baby.  Do not douche or use tampons or scented sanitary pads.  Do not cross your legs for long periods of time.  To prepare for the arrival of your baby: ? Take prenatal classes to understand, practice, and ask questions about labor and delivery. ? Make a trial run to the hospital. ? Visit the hospital and tour the maternity area. ? Arrange for maternity or paternity leave through employers. ? Arrange for family and friends to take care of pets while you are in the hospital. ? Purchase a rear-facing car seat and make sure you know how to install it in your car. ? Pack your hospital bag. ? Prepare the baby's nursery. Make sure to remove all pillows and stuffed animals from the baby's crib to prevent suffocation.  Visit your dentist if you have not gone during your pregnancy. Use a soft toothbrush to brush your teeth and be gentle when you floss. Contact a health care provider if:  You are unsure if you are in labor or if your water has broken.  You become dizzy.  You have mild pelvic cramps, pelvic pressure, or nagging pain in your abdominal area.  You have lower back pain.  You have persistent nausea, vomiting, or  diarrhea.  You have an unusual or bad smelling vaginal discharge.  You have pain when you urinate. Get help right away if:  Your water breaks before 37 weeks.  You have regular contractions less than 5 minutes apart before 37 weeks.  You have a fever.  You are leaking fluid from your vagina.  You have spotting or bleeding from your vagina.  You have severe abdominal pain or cramping.  You have rapid weight loss or weight gain.  You have   shortness of breath with chest pain.  You notice sudden or extreme swelling of your face, hands, ankles, feet, or legs.  Your baby makes fewer than 10 movements in 2 hours.  You have severe headaches that do not go away when you take medicine.  You have vision changes. Summary  The third trimester is from week 28 through week 40, months 7 through 9. The third trimester is a time when the unborn baby (fetus) is growing rapidly.  During the third trimester, your discomfort may increase as you and your baby continue to gain weight. You may have abdominal, leg, and back pain, sleeping problems, and an increased need to urinate.  During the third trimester your breasts will keep growing and they will continue to become tender. A yellow fluid (colostrum) may leak from your breasts. This is the first milk you are producing for your baby.  False labor is a condition in which you feel small, irregular tightenings of the muscles in the womb (contractions) that eventually go away. These are called Braxton Hicks contractions. Contractions may last for hours, days, or even weeks before true labor sets in.  Signs of labor can include: abdominal cramps; regular contractions that start at 10 minutes apart and become stronger and more frequent with time; watery or bloody mucus discharge that comes from the vagina; increased pelvic pressure and dull back pain; and leaking of amniotic fluid. This information is not intended to replace advice given to you by your  health care provider. Make sure you discuss any questions you have with your health care provider. Document Revised: 04/22/2018 Document Reviewed: 02/05/2016 Elsevier Patient Education  2020 [employer] Inc.  

## 2019-06-27 NOTE — Progress Notes (Signed)
   PRENATAL VISIT NOTE  Subjective:  Brenda Mayo is a 24 y.o. [name] at [redacted]w[redacted]d being seen today for ongoing prenatal care.  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  Patient reports movement in lower pelvis. .  Contractions: Not present. Vag. Bleeding: None.  Movement: Present. Denies leaking of fluid.   The following portions of the patient's history were reviewed and updated as appropriate: allergies, current medications, past family history, past medical history, past social history, past surgical history and problem list.   Objective:   Vitals:   06/27/19 0814  BP: 113/69  Pulse: 94  Weight: 233 lb (105.7 kg)    Fetal Status: Fetal Heart Rate (bpm): 159   Movement: Present     General:  Alert, oriented and cooperative. Patient is in no acute distress.  Skin: Skin is warm and dry. No rash noted.   Cardiovascular: Normal heart rate noted  Respiratory: Normal respiratory effort, no problems with respiration noted  Abdomen: Soft, gravid, appropriate for gestational age.  Pain/Pressure: Present     Pelvic: Cervical exam deferred        Extremities: Normal range of motion.  Edema: Trace  Mental Status: Normal mood and affect. Normal behavior. Normal judgment and thought content.   Assessment and Plan:  Pregnancy: [name] at [redacted]w[redacted]d 1. Supervision of other normal pregnancy, antepartum Reviewed childbirth classes.  Good FM. FH and FHR WNL  2. Sickle cell trait in mother affecting pregnancy (HCC)  Preterm labor symptoms and general obstetric precautions including but not limited to vaginal bleeding, contractions, leaking of fluid and fetal movement were reviewed in detail with the patient. Please refer to After Visit Summary for other counseling recommendations.   Return in about 2 weeks (around  07/11/2019) for in person.  No future appointments.  Willodean Rosenthal, MD

## 2019-07-12 ENCOUNTER — Encounter: Payer: Self-pay | Admitting: Advanced Practice Midwife

## 2019-07-12 ENCOUNTER — Other Ambulatory Visit: Payer: Self-pay

## 2019-07-12 ENCOUNTER — Ambulatory Visit (INDEPENDENT_AMBULATORY_CARE_PROVIDER_SITE_OTHER): Payer: Medicaid Other | Admitting: Advanced Practice Midwife

## 2019-07-12 VITALS — BP 123/67 | HR 93 | Wt 240.0 lb

## 2019-07-12 DIAGNOSIS — Z3480 Encounter for supervision of other normal pregnancy, unspecified trimester: Secondary | ICD-10-CM

## 2019-07-12 DIAGNOSIS — D573 Sickle-cell trait: Secondary | ICD-10-CM

## 2019-07-12 DIAGNOSIS — Z3A34 34 weeks gestation of pregnancy: Secondary | ICD-10-CM

## 2019-07-12 DIAGNOSIS — O99013 Anemia complicating pregnancy, third trimester: Secondary | ICD-10-CM

## 2019-07-12 NOTE — Patient Instructions (Signed)
Third Trimester of Pregnancy The third trimester is from week 28 through week 40 (months 7 through 9). The third trimester is a time when the unborn baby (fetus) is growing rapidly. At the end of the ninth month, the fetus is about 20 inches in length and weighs 6-10 pounds. Body changes during your third trimester Your body will continue to go through many changes during pregnancy. The changes vary from woman to woman. During the third trimester:  Your weight will continue to increase. You can expect to gain 25-35 pounds (11-16 kg) by the end of the pregnancy.  You may begin to get stretch marks on your hips, abdomen, and breasts.  You may urinate more often because the fetus is moving lower into your pelvis and pressing on your bladder.  You may develop or continue to have heartburn. This is caused by increased hormones that slow down muscles in the digestive tract.  You may develop or continue to have constipation because increased hormones slow digestion and cause the muscles that push waste through your intestines to relax.  You may develop hemorrhoids. These are swollen veins (varicose veins) in the rectum that can itch or be painful.  You may develop swollen, bulging veins (varicose veins) in your legs.  You may have increased body aches in the pelvis, back, or thighs. This is due to weight gain and increased hormones that are relaxing your joints.  You may have changes in your hair. These can include thickening of your hair, rapid growth, and changes in texture. Some women also have hair loss during or after pregnancy, or hair that feels dry or thin. Your hair will most likely return to normal after your baby is born.  Your breasts will continue to grow and they will continue to become tender. A yellow fluid (colostrum) may leak from your breasts. This is the first milk you are producing for your baby.  Your belly button may stick out.  You may notice more swelling in your hands,  face, or ankles.  You may have increased tingling or numbness in your hands, arms, and legs. The skin on your belly may also feel numb.  You may feel short of breath because of your expanding uterus.  You may have more problems sleeping. This can be caused by the size of your belly, increased need to urinate, and an increase in your body's metabolism.  You may notice the fetus "dropping," or moving lower in your abdomen (lightening).  You may have increased vaginal discharge.  You may notice your joints feel loose and you may have pain around your pelvic bone. What to expect at prenatal visits You will have prenatal exams every 2 weeks until week 36. Then you will have weekly prenatal exams. During a routine prenatal visit:  You will be weighed to make sure you and the baby are growing normally.  Your blood pressure will be taken.  Your abdomen will be measured to track your baby's growth.  The fetal heartbeat will be listened to.  Any test results from the previous visit will be discussed.  You may have a cervical check near your due date to see if your cervix has softened or thinned (effaced).  You will be tested for Group B streptococcus. This happens between 35 and 37 weeks. Your health care provider may ask you:  What your birth plan is.  How you are feeling.  If you are feeling the baby move.  If you have had any abnormal   symptoms, such as leaking fluid, bleeding, severe headaches, or abdominal cramping.  If you are using any tobacco products, including cigarettes, chewing tobacco, and electronic cigarettes.  If you have any questions. Other tests or screenings that may be performed during your third trimester include:  Blood tests that check for low iron levels (anemia).  Fetal testing to check the health, activity level, and growth of the fetus. Testing is done if you have certain medical conditions or if there are problems during the pregnancy.  Nonstress test  (NST). This test checks the health of your baby to make sure there are no signs of problems, such as the baby not getting enough oxygen. During this test, a belt is placed around your belly. The baby is made to move, and its heart rate is monitored during movement. What is false labor? False labor is a condition in which you feel small, irregular tightenings of the muscles in the womb (contractions) that usually go away with rest, changing position, or drinking water. These are called Braxton Hicks contractions. Contractions may last for hours, days, or even weeks before true labor sets in. If contractions come at regular intervals, become more frequent, increase in intensity, or become painful, you should see your health care provider. What are the signs of labor?  Abdominal cramps.  Regular contractions that start at 10 minutes apart and become stronger and more frequent with time.  Contractions that start on the top of the uterus and spread down to the lower abdomen and back.  Increased pelvic pressure and dull back pain.  A watery or bloody mucus discharge that comes from the vagina.  Leaking of amniotic fluid. This is also known as your "water breaking." It could be a slow trickle or a gush. Let your health care provider know if it has a color or strange odor. If you have any of these signs, call your health care provider right away, even if it is before your due date. Follow these instructions at home: Medicines  Follow your health care provider's instructions regarding medicine use. Specific medicines may be either safe or unsafe to take during pregnancy.  Take a prenatal vitamin that contains at least 600 micrograms (mcg) of folic acid.  If you develop constipation, try taking a stool softener if your health care provider approves. Eating and drinking   Eat a balanced diet that includes fresh fruits and vegetables, whole grains, good sources of protein such as meat, eggs, or tofu,  and low-fat dairy. Your health care provider will help you determine the amount of weight gain that is right for you.  Avoid raw meat and uncooked cheese. These carry germs that can cause birth defects in the baby.  If you have low calcium intake from food, talk to your health care provider about whether you should take a daily calcium supplement.  Eat four or five small meals rather than three large meals a day.  Limit foods that are high in fat and processed sugars, such as fried and sweet foods.  To prevent constipation: ? Drink enough fluid to keep your urine clear or pale yellow. ? Eat foods that are high in fiber, such as fresh fruits and vegetables, whole grains, and beans. Activity  Exercise only as directed by your health care provider. Most women can continue their usual exercise routine during pregnancy. Try to exercise for 30 minutes at least 5 days a week. Stop exercising if you experience uterine contractions.  Avoid heavy lifting.  Do   not exercise in extreme heat or humidity, or at high altitudes.  Wear low-heel, comfortable shoes.  Practice good posture.  You may continue to have sex unless your health care provider tells you otherwise. Relieving pain and discomfort  Take frequent breaks and rest with your legs elevated if you have leg cramps or low back pain.  Take warm sitz baths to soothe any pain or discomfort caused by hemorrhoids. Use hemorrhoid cream if your health care provider approves.  Wear a good support bra to prevent discomfort from breast tenderness.  If you develop varicose veins: ? Wear support pantyhose or compression stockings as told by your healthcare provider. ? Elevate your feet for 15 minutes, 3-4 times a day. Prenatal care  Write down your questions. Take them to your prenatal visits.  Keep all your prenatal visits as told by your health care provider. This is important. Safety  Wear your seat belt at all times when driving.  Make  a list of emergency phone numbers, including numbers for family, friends, the hospital, and police and fire departments. General instructions  Avoid cat litter boxes and soil used by cats. These carry germs that can cause birth defects in the baby. If you have a cat, ask someone to clean the litter box for you.  Do not travel far distances unless it is absolutely necessary and only with the approval of your health care provider.  Do not use hot tubs, steam rooms, or saunas.  Do not drink alcohol.  Do not use any products that contain nicotine or tobacco, such as cigarettes and e-cigarettes. If you need help quitting, ask your health care provider.  Do not use any medicinal herbs or unprescribed drugs. These chemicals affect the formation and growth of the baby.  Do not douche or use tampons or scented sanitary pads.  Do not cross your legs for long periods of time.  To prepare for the arrival of your baby: ? Take prenatal classes to understand, practice, and ask questions about labor and delivery. ? Make a trial run to the hospital. ? Visit the hospital and tour the maternity area. ? Arrange for maternity or paternity leave through employers. ? Arrange for family and friends to take care of pets while you are in the hospital. ? Purchase a rear-facing car seat and make sure you know how to install it in your car. ? Pack your hospital bag. ? Prepare the baby's nursery. Make sure to remove all pillows and stuffed animals from the baby's crib to prevent suffocation.  Visit your dentist if you have not gone during your pregnancy. Use a soft toothbrush to brush your teeth and be gentle when you floss. Contact a health care provider if:  You are unsure if you are in labor or if your water has broken.  You become dizzy.  You have mild pelvic cramps, pelvic pressure, or nagging pain in your abdominal area.  You have lower back pain.  You have persistent nausea, vomiting, or  diarrhea.  You have an unusual or bad smelling vaginal discharge.  You have pain when you urinate. Get help right away if:  Your water breaks before 37 weeks.  You have regular contractions less than 5 minutes apart before 37 weeks.  You have a fever.  You are leaking fluid from your vagina.  You have spotting or bleeding from your vagina.  You have severe abdominal pain or cramping.  You have rapid weight loss or weight gain.  You have   shortness of breath with chest pain.  You notice sudden or extreme swelling of your face, hands, ankles, feet, or legs.  Your baby makes fewer than 10 movements in 2 hours.  You have severe headaches that do not go away when you take medicine.  You have vision changes. Summary  The third trimester is from week 28 through week 40, months 7 through 9. The third trimester is a time when the unborn baby (fetus) is growing rapidly.  During the third trimester, your discomfort may increase as you and your baby continue to gain weight. You may have abdominal, leg, and back pain, sleeping problems, and an increased need to urinate.  During the third trimester your breasts will keep growing and they will continue to become tender. A yellow fluid (colostrum) may leak from your breasts. This is the first milk you are producing for your baby.  False labor is a condition in which you feel small, irregular tightenings of the muscles in the womb (contractions) that eventually go away. These are called Braxton Hicks contractions. Contractions may last for hours, days, or even weeks before true labor sets in.  Signs of labor can include: abdominal cramps; regular contractions that start at 10 minutes apart and become stronger and more frequent with time; watery or bloody mucus discharge that comes from the vagina; increased pelvic pressure and dull back pain; and leaking of amniotic fluid. This information is not intended to replace advice given to you by your  health care provider. Make sure you discuss any questions you have with your health care provider. Document Revised: 04/22/2018 Document Reviewed: 02/05/2016 Elsevier Patient Education  2020 [employer] Inc.  

## 2019-07-12 NOTE — Progress Notes (Signed)
   PRENATAL VISIT NOTE  Subjective:  Brenda Mayo is a 24 y.o. [name] at [redacted]w[redacted]d being seen today for ongoing prenatal care.  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  Patient reports no complaints.  Contractions: Not present. Vag. Bleeding: None.  Movement: Present. Denies leaking of fluid.   The following portions of the patient's history were reviewed and updated as appropriate: allergies, current medications, past family history, past medical history, past social history, past surgical history and problem list.   Objective:   Vitals:   07/12/19 1002  BP: 123/67  Pulse: 93  Weight: 240 lb (108.9 kg)    Fetal Status: Fetal Heart Rate (bpm): 138   Movement: Present     General:  Alert, oriented and cooperative. Patient is in no acute distress.  Skin: Skin is warm and dry. No rash noted.   Cardiovascular: Normal heart rate noted  Respiratory: Normal respiratory effort, no problems with respiration noted  Abdomen: Soft, gravid, appropriate for gestational age.  Pain/Pressure: Present     Pelvic: Cervical exam deferred        Extremities: Normal range of motion.  Edema: Trace  Mental Status: Normal mood and affect. Normal behavior. Normal judgment and thought content.   Assessment and Plan:  Pregnancy: [name] at [redacted]w[redacted]d 1. Supervision of other normal pregnancy, antepartum     Doing well.   Reviewed signs of labor/PTL  2. Sickle cell trait in mother affecting pregnancy (HCC)      Hgb 10.9 on 06/06/19  Preterm labor symptoms and general obstetric precautions including but not limited to vaginal bleeding, contractions, leaking of fluid and fetal movement were reviewed in detail with the patient. Please refer to After Visit Summary for other counseling recommendations.   Return in about 2 weeks (around  07/26/2019) for [facility].  Future Appointments  Date Time Provider Department Center  07/25/2019  9:00 AM Levie Heritage, DO [facility] None  08/01/2019  9:00 AM Willodean Rosenthal, MD [facility] None  08/08/2019 10:00 AM Willodean Rosenthal, MD [facility] None    Wynelle Bourgeois, CNM

## 2019-07-20 ENCOUNTER — Telehealth: Payer: Self-pay

## 2019-07-20 NOTE — Telephone Encounter (Signed)
Pt called stating she is having Braxton hicks contractions. Pt denies leakage of fluid and bleeding. Pt states she wants start FMLA early because she works from home and she sits a lot and that causes her to have more pain. Advised pt to increase her water intake and to rest when she has Braxton hicks contractions. Advised pt   to go to MAU if she notices a gush of fluids. Advised pt to bring her FMLA papers at next visit and to discuss with provider. Understanding was voiced. Allexus Ovens l Marcell Chavarin, CMA

## 2019-07-25 ENCOUNTER — Other Ambulatory Visit: Payer: Self-pay

## 2019-07-25 ENCOUNTER — Ambulatory Visit (INDEPENDENT_AMBULATORY_CARE_PROVIDER_SITE_OTHER): Payer: Medicaid Other | Admitting: Family Medicine

## 2019-07-25 ENCOUNTER — Other Ambulatory Visit (HOSPITAL_COMMUNITY)
Admission: RE | Admit: 2019-07-25 | Discharge: 2019-07-25 | Disposition: A | Discharge status: Home / Self Care | Payer: Medicaid Other | Source: Ambulatory Visit | Attending: Family Medicine | Admitting: Family Medicine

## 2019-07-25 VITALS — BP 112/70 | HR 103 | Wt 243.0 lb

## 2019-07-25 DIAGNOSIS — Z3480 Encounter for supervision of other normal pregnancy, unspecified trimester: Secondary | ICD-10-CM

## 2019-07-25 DIAGNOSIS — D573 Sickle-cell trait: Secondary | ICD-10-CM

## 2019-07-25 DIAGNOSIS — Z3483 Encounter for supervision of other normal pregnancy, third trimester: Secondary | ICD-10-CM

## 2019-07-25 DIAGNOSIS — Z3A35 35 weeks gestation of pregnancy: Secondary | ICD-10-CM

## 2019-07-25 LAB — OB RESULTS CONSOLE GC/CHLAMYDIA: Gonorrhea: NEGATIVE

## 2019-07-25 NOTE — Progress Notes (Signed)
   PRENATAL VISIT NOTE  Subjective:  Brenda Mayo is a 24 y.o. [name] at [redacted]w[redacted]d being seen today for ongoing prenatal care.  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  Patient reports occasional contractions.  Contractions: Irregular. Vag. Bleeding: None.  Movement: Present. Denies leaking of fluid.   The following portions of the patient's history were reviewed and updated as appropriate: allergies, current medications, past family history, past medical history, past social history, past surgical history and problem list.   Objective:   Vitals:   07/25/19 0913  BP: 112/70  Pulse: (!) 103  Weight: 243 lb (110.2 kg)    Fetal Status: Fetal Heart Rate (bpm): 140   Movement: Present     General:  Alert, oriented and cooperative. Patient is in no acute distress.  Skin: Skin is warm and dry. No rash noted.   Cardiovascular: Normal heart rate noted  Respiratory: Normal respiratory effort, no problems with respiration noted  Abdomen: Soft, gravid, appropriate for gestational age.  Pain/Pressure: Present     Pelvic: Cervical exam deferred        Extremities: Normal range of motion.  Edema: Trace  Mental Status: Normal mood and affect. Normal behavior. Normal judgment and thought content.   Assessment and Plan:  Pregnancy: [name] at [redacted]w[redacted]d 1. Supervision of other normal pregnancy, antepartum FHT and FH normal - Culture, beta strep (group b only) - GC/Chlamydia probe amp ([place])not at [facility]  2. Sickle cell trait in mother affecting pregnancy (HCC)   Preterm labor symptoms and general obstetric precautions including but not limited to vaginal bleeding, contractions, leaking of fluid and fetal movement were reviewed in detail with the patient. Please refer to After Visit Summary for other counseling  recommendations.   Return in about 1 week (around 08/01/2019) for OB f/u, In Office.  Future Appointments  Date Time Provider Department Center  08/01/2019  9:00 AM Willodean Rosenthal, MD [facility] None  08/08/2019 10:00 AM Willodean Rosenthal, MD [facility] None    Levie Heritage, DO

## 2019-07-25 NOTE — Progress Notes (Signed)
Patient states she would like to opt out of the 36 week cultures because she know they will be negative. I have explained in length that it is not a STD that it is normal flora found in the vagina. Made her aware that we test for the presence of GBS and she would receive antibiotics during delivery to prevent her baby from getting any infection if she was GBS positive. Patient would like to speak with Dr. Adrian Blackwater about deferring the GBS and GC/Chl test. [name] RN

## 2019-07-26 LAB — GC/CHLAMYDIA PROBE AMP (~~LOC~~) NOT AT ARMC
Chlamydia: NEGATIVE
Comment: NEGATIVE
Comment: NORMAL
Neisseria Gonorrhea: NEGATIVE

## 2019-07-29 LAB — CULTURE, BETA STREP (GROUP B ONLY): Strep Gp B Culture: NEGATIVE

## 2019-08-01 ENCOUNTER — Ambulatory Visit (INDEPENDENT_AMBULATORY_CARE_PROVIDER_SITE_OTHER): Payer: Medicaid Other | Admitting: Obstetrics & Gynecology

## 2019-08-01 ENCOUNTER — Other Ambulatory Visit: Payer: Self-pay

## 2019-08-01 VITALS — BP 110/85 | HR 107 | Wt 251.0 lb

## 2019-08-01 DIAGNOSIS — Z3A36 36 weeks gestation of pregnancy: Secondary | ICD-10-CM

## 2019-08-01 DIAGNOSIS — O99019 Anemia complicating pregnancy, unspecified trimester: Secondary | ICD-10-CM

## 2019-08-01 DIAGNOSIS — O99013 Anemia complicating pregnancy, third trimester: Secondary | ICD-10-CM

## 2019-08-01 DIAGNOSIS — Z3480 Encounter for supervision of other normal pregnancy, unspecified trimester: Secondary | ICD-10-CM

## 2019-08-01 DIAGNOSIS — D573 Sickle-cell trait: Secondary | ICD-10-CM

## 2019-08-01 NOTE — Progress Notes (Signed)
   PRENATAL VISIT NOTE  Subjective:  Brenda Mayo is a 24 y.o. [name] at [redacted]w[redacted]d being seen today for ongoing prenatal care.  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  Patient reports no complaints.  Contractions: Irritability. Vag. Bleeding: None.  Movement: Present. Denies leaking of fluid.   The following portions of the patient's history were reviewed and updated as appropriate: allergies, current medications, past family history, past medical history, past social history, past surgical history and problem list.   Objective:   Vitals:   08/01/19 0913  BP: 110/85  Pulse: (!) 107  Weight: 251 lb (113.9 kg)    Fetal Status: Fetal Heart Rate (bpm): 135   Movement: Present     General:  Alert, oriented and cooperative. Patient is in no acute distress.  Skin: Skin is warm and dry. No rash noted.   Cardiovascular: Normal heart rate noted  Respiratory: Normal respiratory effort, no problems with respiration noted  Abdomen: Soft, gravid, appropriate for gestational age.  Pain/Pressure: Present     Pelvic: Cervical exam deferred        Extremities: Normal range of motion.  Edema: Trace  Mental Status: Normal mood and affect. Normal behavior. Normal judgment and thought content.   Assessment and Plan:  Pregnancy: [name] at [redacted]w[redacted]d 1. Supervision of other normal pregnancy, antepartum FH and FHR WNL  2. Sickle cell trait in mother affecting pregnancy (HCC)   Preterm labor symptoms and general obstetric precautions including but not limited to vaginal bleeding, contractions, leaking of fluid and fetal movement were reviewed in detail with the patient. Please refer to After Visit Summary for other counseling recommendations.   Return in about 1 week (around 08/08/2019).  Future Appointments  Date Time Provider  Department Center  08/08/2019 10:00 AM Willodean Rosenthal, MD [facility] None    Willodean Rosenthal, MD

## 2019-08-01 NOTE — Progress Notes (Signed)
Patient complaining of insomina. [name] RN

## 2019-08-08 ENCOUNTER — Other Ambulatory Visit: Payer: Self-pay

## 2019-08-08 ENCOUNTER — Ambulatory Visit (INDEPENDENT_AMBULATORY_CARE_PROVIDER_SITE_OTHER): Payer: Medicaid Other | Admitting: Obstetrics & Gynecology

## 2019-08-08 VITALS — BP 112/76 | HR 116 | Wt 252.0 lb

## 2019-08-08 DIAGNOSIS — O99019 Anemia complicating pregnancy, unspecified trimester: Secondary | ICD-10-CM

## 2019-08-08 DIAGNOSIS — Z3480 Encounter for supervision of other normal pregnancy, unspecified trimester: Secondary | ICD-10-CM

## 2019-08-08 DIAGNOSIS — Z3483 Encounter for supervision of other normal pregnancy, third trimester: Secondary | ICD-10-CM

## 2019-08-08 DIAGNOSIS — D573 Sickle-cell trait: Secondary | ICD-10-CM

## 2019-08-08 NOTE — Progress Notes (Signed)
   PRENATAL VISIT NOTE  Subjective:  Brenda Mayo is a 24 y.o. [name] at [redacted]w[redacted]d being seen today for ongoing prenatal care.  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  Patient reports no complaints.  Contractions: Irregular. Vag. Bleeding: None.  Movement: Present. Denies leaking of fluid.   The following portions of the patient's history were reviewed and updated as appropriate: allergies, current medications, past family history, past medical history, past social history, past surgical history and problem list.   Objective:   Vitals:   08/08/19 1013  BP: 112/76  Pulse: (!) 116  Weight: (!) 252 lb (114.3 kg)    Fetal Status:     Movement: Present     General:  Alert, oriented and cooperative. Patient is in no acute distress.  Skin: Skin is warm and dry. No rash noted.   Cardiovascular: Normal heart rate noted  Respiratory: Normal respiratory effort, no problems with respiration noted  Abdomen: Soft, gravid, appropriate for gestational age.  Pain/Pressure: Present     Pelvic: Cervical exam deferred        Extremities: Normal range of motion.  Edema: Trace  Mental Status: Normal mood and affect. Normal behavior. Normal judgment and thought content.   Assessment and Plan:  Pregnancy: [name] at [redacted]w[redacted]d 1. Supervision of other normal pregnancy, antepartum FH and FHR  2. Sickle cell trait in mother affecting pregnancy [facility])  Term labor symptoms and general obstetric precautions including but not limited to vaginal bleeding, contractions, leaking of fluid and fetal movement were reviewed in detail with the patient. Please refer to After Visit Summary for other counseling recommendations.   Return in about 1 week (around 08/15/2019).  Future Appointments  Date Time Provider Department Center  08/16/2019  8:10  AM Aviva Signs, CNM [facility] None    Willodean Rosenthal, MD

## 2019-08-15 ENCOUNTER — Telehealth: Payer: Self-pay

## 2019-08-15 ENCOUNTER — Encounter (HOSPITAL_COMMUNITY): Payer: Self-pay | Admitting: Family Medicine

## 2019-08-15 ENCOUNTER — Other Ambulatory Visit: Payer: Self-pay

## 2019-08-15 ENCOUNTER — Inpatient Hospital Stay (HOSPITAL_COMMUNITY)
Admission: AD | Admit: 2019-08-15 | Discharge: 2019-08-15 | Disposition: A | Discharge status: Home Health | Payer: Medicaid Other | Attending: Family Medicine | Admitting: Family Medicine

## 2019-08-15 DIAGNOSIS — Z880 Allergy status to penicillin: Secondary | ICD-10-CM | POA: Diagnosis not present

## 2019-08-15 DIAGNOSIS — Z3A38 38 weeks gestation of pregnancy: Secondary | ICD-10-CM | POA: Diagnosis not present

## 2019-08-15 DIAGNOSIS — O26893 Other specified pregnancy related conditions, third trimester: Secondary | ICD-10-CM | POA: Diagnosis not present

## 2019-08-15 DIAGNOSIS — Z3689 Encounter for other specified antenatal screening: Secondary | ICD-10-CM

## 2019-08-15 DIAGNOSIS — N898 Other specified noninflammatory disorders of vagina: Secondary | ICD-10-CM

## 2019-08-15 DIAGNOSIS — D573 Sickle-cell trait: Secondary | ICD-10-CM | POA: Insufficient documentation

## 2019-08-15 DIAGNOSIS — O471 False labor at or after 37 completed weeks of gestation: Secondary | ICD-10-CM | POA: Diagnosis present

## 2019-08-15 DIAGNOSIS — O99891 Other specified diseases and conditions complicating pregnancy: Secondary | ICD-10-CM | POA: Diagnosis not present

## 2019-08-15 LAB — AMNISURE RUPTURE OF MEMBRANE (ROM) NOT AT ARMC: Amnisure ROM: NEGATIVE

## 2019-08-15 NOTE — MAU Provider Note (Signed)
History   [id number]   Chief Complaint  Patient presents with  . Rupture of Membranes    HPI Brenda Mayo is a 24 y.o. female  [name] @38 .6 wks here with report of leaking fluid since last night. No gush of fluid. Describes as white not watery. Had to change underwear a few times. Pt reports irregular contractions. She denies vaginal bleeding. Last intercourse was 1-2 days ago. She reports + fetal movement. All other systems negative.    Patient's last menstrual period was 11/23/2018 (approximate).  OB History  Gravida Para Term Preterm AB Living  2       1    SAB TAB Ectopic Multiple Live Births  1            # Outcome Date GA Lbr Len/2nd Weight Sex Delivery Anes PTL Lv  2 Current           1 SAB 2017            Past Medical History:  Diagnosis Date  . Anemia   . Sickle cell trait (HCC)     Family History  Problem Relation Age of Onset  . Sickle cell trait Mother     Social History   Socioeconomic History  . Marital status: Single    Spouse name: Not on file  . Number of children: Not on file  . Years of education: Not on file  . Highest education level: Not on file  Occupational History  . Not on file  Tobacco Use  . Smoking status: Never Smoker  . Smokeless tobacco: Never Used  Vaping Use  . Vaping Use: Never used  Substance and Sexual Activity  . Alcohol use: Never  . Drug use: Never  . Sexual activity: Yes    Birth control/protection: None  Other Topics Concern  . Not on file  Social History Narrative  . Not on file   Social Determinants of Health   Financial Resource Strain:   . Difficulty of Paying Living Expenses:   Food Insecurity:   . Worried About 2018 in the Last Year:   . Programme researcher, broadcasting/film/video in the Last Year:   Transportation Needs:   . Barista (Medical):   Freight forwarder Lack of Transportation (Non-Medical):   Physical Activity:   . Days of Exercise per Week:   . Minutes of Exercise per Session:   Stress:   .  Feeling of Stress :   Social Connections:   . Frequency of Communication with Friends and Family:   . Frequency of Social Gatherings with Friends and Family:   . Attends Religious Services:   . Active Member of Clubs or Organizations:   . Attends [employer] Meetings:   Banker Marital Status:     Allergies  Allergen Reactions  . Penicillins     Reports as child, does not know the reaction type.   . Tomato Hives    No current facility-administered medications on file prior to encounter.   Current Outpatient Medications on File Prior to Encounter  Medication Sig Dispense Refill  . ferrous sulfate 325 (65 FE) MG tablet Take 325 mg by mouth daily with breakfast.     . prenatal vitamin w/FE, FA (PRENATAL 1 + 1) 27-1 MG TABS tablet Take 1 tablet by mouth daily at 12 noon.       Review of Systems  Gastrointestinal: Negative for abdominal pain.  Genitourinary: Positive for vaginal discharge. Negative for vaginal  bleeding.   Physical Exam   Vitals:   08/15/19 1226 08/15/19 1409 08/15/19 1414 08/15/19 1450  BP: 131/88   134/70  Pulse: (!) 117   100  Resp: 16     Temp: 98.3 F (36.8 C)     SpO2: 100% 99% 98%    Physical Exam Vitals and nursing note reviewed. Exam conducted with a chaperone present.  Constitutional:      General: She is not in acute distress. HENT:     Head: Normocephalic.  Pulmonary:     Effort: Pulmonary effort is normal. No respiratory distress.  Genitourinary:    Comments: Attempted SSE but could not tolerate, white discharge on speculum, no fluid seen Musculoskeletal:        General: Normal range of motion.  Skin:    General: Skin is warm and dry.  Neurological:     General: No focal deficit present.     Mental Status: She is alert.  Psychiatric:        Mood and Affect: Mood normal.   EFM: 140 bpm, mod variability, + accels, no decels Toco: irritability  Results for orders placed or performed during the hospital encounter of 08/15/19 (from  the past 24 hour(s))  Amnisure rupture of membrane (rom)not at [facility]     Status: None   Collection Time: 08/15/19  2:00 PM  Result Value Ref Range   Amnisure ROM NEGATIVE    MAU Course  Procedures  MDM Unable to tolerate speculum exam, Amnisure ordered. No evidence of SROM. Stable for discharge home.   Assessment and Plan   1. [redacted] weeks gestation of pregnancy   2. NST (non-stress test) reactive   3. Vaginal discharge during pregnancy in third trimester    Discharge home Follow up at [facility] as scheduled Labor precautions  Allergies as of 08/15/2019      Reactions   Penicillins    Reports as child, does not know the reaction type.    Tomato Hives      Medication List    TAKE these medications   ferrous sulfate 325 (65 FE) MG tablet Take 325 mg by mouth daily with breakfast.   prenatal vitamin w/FE, FA 27-1 MG Tabs tablet Take 1 tablet by mouth daily at 12 noon.       Donette Larry, CNM 08/15/2019 3:24 PM

## 2019-08-15 NOTE — Telephone Encounter (Signed)
Patient called stating that her water broke last night around 6pm. Patient states it was just a little bit but then she realized the water kept coming out. Patient states she only having like one to two contractions an hour. Patient instructed that since her water is broken she needs to go to MAU (entrance C). Explained to patient that if her water is broken we dont want to wait around for her contractions to come she needs to [facility] directly [facility]. Patient has someone to drive her. [name] RN

## 2019-08-15 NOTE — Discharge Instructions (Signed)
Braxton Hicks Contractions °Contractions of the uterus can occur throughout pregnancy, but they are not always a sign that you are in labor. You may have practice contractions called Braxton Hicks contractions. These false labor contractions are sometimes confused with true labor. °What are Braxton Hicks contractions? °Braxton Hicks contractions are tightening movements that occur in the muscles of the uterus before labor. Unlike true labor contractions, these contractions do not result in opening (dilation) and thinning of the cervix. Toward the end of pregnancy (32-34 weeks), Braxton Hicks contractions can happen more often and may become stronger. These contractions are sometimes difficult to tell apart from true labor because they can be very uncomfortable. You should not feel embarrassed if you go to the hospital with false labor. °Sometimes, the only way to tell if you are in true labor is for your health care provider to look for changes in the cervix. The health care provider will do a physical exam and may monitor your contractions. If you are not in true labor, the exam should show that your cervix is not dilating and your water has not broken. °If there are no other health problems associated with your pregnancy, it is completely safe for you to be sent home with false labor. You may continue to have Braxton Hicks contractions until you go into true labor. °How to tell the difference between true labor and false labor °True labor °· Contractions last 30-70 seconds. °· Contractions become very regular. °· Discomfort is usually felt in the top of the uterus, and it spreads to the lower abdomen and low back. °· Contractions do not go away with walking. °· Contractions usually become more intense and increase in frequency. °· The cervix dilates and gets thinner. °False labor °· Contractions are usually shorter and not as strong as true labor contractions. °· Contractions are usually irregular. °· Contractions  are often felt in the front of the lower abdomen and in the groin. °· Contractions may go away when you walk around or change positions while lying down. °· Contractions get weaker and are shorter-lasting as time goes on. °· The cervix usually does not dilate or become thin. °Follow these instructions at home: ° °· Take over-the-counter and prescription medicines only as told by your health care provider. °· Keep up with your usual exercises and follow other instructions from your health care provider. °· Eat and drink lightly if you think you are going into labor. °· If Braxton Hicks contractions are making you uncomfortable: °? Change your position from lying down or resting to walking, or change from walking to resting. °? Sit and rest in a tub of warm water. °? Drink enough fluid to keep your urine pale yellow. Dehydration may cause these contractions. °? Do slow and deep breathing several times an hour. °· Keep all follow-up prenatal visits as told by your health care provider. This is important. °Contact a health care provider if: °· You have a fever. °· You have continuous pain in your abdomen. °Get help right away if: °· Your contractions become stronger, more regular, and closer together. °· You have fluid leaking or gushing from your vagina. °· You pass blood-tinged mucus (bloody show). °· You have bleeding from your vagina. °· You have low back pain that you never had before. °· You feel your baby’s head pushing down and causing pelvic pressure. °· Your baby is not moving inside you as much as it used to. °Summary °· Contractions that occur before labor are   called Braxton Hicks contractions, false labor, or practice contractions. °· Braxton Hicks contractions are usually shorter, weaker, farther apart, and less regular than true labor contractions. True labor contractions usually become progressively stronger and regular, and they become more frequent. °· Manage discomfort from Braxton Hicks contractions  by changing position, resting in a warm bath, drinking plenty of water, or practicing deep breathing. °This information is not intended to replace advice given to you by your health care provider. Make sure you discuss any questions you have with your health care provider. °Document Revised: 12/12/2016 Document Reviewed: 05/15/2016 °Elsevier Patient Education © 2020 [employer] ° °

## 2019-08-15 NOTE — MAU Note (Signed)
.   Brenda Mayo is a 24 y.o. at [redacted]w[redacted]d here in MAU reporting: leakage of clear fluid since yesterday evening around 7pm. Reports abdominal pain that feels like the baby is kicking. Denies any VB. +FM  Onset of complaint: 08/14/19 7pm Pain score: 8 Vitals:   08/15/19 1226  BP: 131/88  Pulse: (!) 117  Resp: 16  Temp: 98.3 F (36.8 C)  SpO2: 100%     FHT:144 Lab orders placed from triage:

## 2019-08-16 ENCOUNTER — Encounter: Payer: Medicaid Other | Admitting: Advanced Practice Midwife

## 2019-08-18 ENCOUNTER — Other Ambulatory Visit: Payer: Self-pay

## 2019-08-18 ENCOUNTER — Ambulatory Visit (INDEPENDENT_AMBULATORY_CARE_PROVIDER_SITE_OTHER): Payer: Medicaid Other | Admitting: Family Medicine

## 2019-08-18 VITALS — BP 124/76 | HR 120 | Wt 251.0 lb

## 2019-08-18 DIAGNOSIS — O99019 Anemia complicating pregnancy, unspecified trimester: Secondary | ICD-10-CM

## 2019-08-18 DIAGNOSIS — D573 Sickle-cell trait: Secondary | ICD-10-CM

## 2019-08-18 DIAGNOSIS — Z3480 Encounter for supervision of other normal pregnancy, unspecified trimester: Secondary | ICD-10-CM

## 2019-08-18 NOTE — Progress Notes (Signed)
   PRENATAL VISIT NOTE  Subjective:  Brenda Mayo is a 24 y.o. [name] at [redacted]w[redacted]d being seen today for ongoing prenatal care.  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  Patient reports occasional contractions.  Contractions: Irritability. Vag. Bleeding: None.  Movement: Present. Denies leaking of fluid.   The following portions of the patient's history were reviewed and updated as appropriate: allergies, current medications, past family history, past medical history, past social history, past surgical history and problem list.   Objective:   Vitals:   08/18/19 1533  BP: 124/76  Pulse: (!) 120  Weight: 251 lb (113.9 kg)    Fetal Status:     Movement: Present     General:  Alert, oriented and cooperative. Patient is in no acute distress.  Skin: Skin is warm and dry. No rash noted.   Cardiovascular: Normal heart rate noted  Respiratory: Normal respiratory effort, no problems with respiration noted  Abdomen: Soft, gravid, appropriate for gestational age.  Pain/Pressure: Present     Pelvic: Cervical exam deferred        Extremities: Normal range of motion.  Edema: Trace  Mental Status: Normal mood and affect. Normal behavior. Normal judgment and thought content.   Assessment and Plan:  Pregnancy: [name] at [redacted]w[redacted]d 1. Supervision of other normal pregnancy, antepartum FHT and FH normal.  Induce after 40 weeks for term  2. Sickle cell trait in mother affecting pregnancy (HCC)   Preterm labor symptoms and general obstetric precautions including but not limited to vaginal bleeding, contractions, leaking of fluid and fetal movement were reviewed in detail with the patient. Please refer to After Visit Summary for other counseling recommendations.   No follow-ups on file.  Future Appointments  Date Time  Provider Department Center  08/23/2019  6:30 AM [facility] SCHED ROOM [facility] None  10/07/2019 10:30 AM Levie Heritage, DO [facility] None    Levie Heritage, DO

## 2019-08-19 ENCOUNTER — Telehealth (HOSPITAL_COMMUNITY): Payer: Self-pay | Admitting: *Deleted

## 2019-08-19 ENCOUNTER — Encounter (HOSPITAL_COMMUNITY): Payer: Self-pay | Admitting: *Deleted

## 2019-08-19 NOTE — Telephone Encounter (Signed)
Preadmission screen  

## 2019-08-21 ENCOUNTER — Other Ambulatory Visit: Payer: Self-pay | Admitting: Family Medicine

## 2019-08-22 ENCOUNTER — Other Ambulatory Visit (HOSPITAL_COMMUNITY): Payer: Medicaid Other | Attending: Family Medicine

## 2019-08-23 ENCOUNTER — Other Ambulatory Visit: Payer: Self-pay

## 2019-08-23 ENCOUNTER — Encounter (HOSPITAL_COMMUNITY): Payer: Self-pay | Admitting: Family Medicine

## 2019-08-23 ENCOUNTER — Inpatient Hospital Stay (HOSPITAL_COMMUNITY): Payer: Medicaid Other

## 2019-08-23 ENCOUNTER — Inpatient Hospital Stay (HOSPITAL_COMMUNITY)
Admission: AD | Admit: 2019-08-23 | Discharge: 2019-08-26 | DRG: 807 | Disposition: A | Discharge status: Home / Self Care | Payer: Medicaid Other | Attending: Family Medicine | Admitting: Family Medicine

## 2019-08-23 DIAGNOSIS — D573 Sickle-cell trait: Secondary | ICD-10-CM | POA: Diagnosis present

## 2019-08-23 DIAGNOSIS — Z3A40 40 weeks gestation of pregnancy: Secondary | ICD-10-CM

## 2019-08-23 DIAGNOSIS — O9902 Anemia complicating childbirth: Secondary | ICD-10-CM | POA: Diagnosis present

## 2019-08-23 DIAGNOSIS — Z20822 Contact with and (suspected) exposure to covid-19: Secondary | ICD-10-CM | POA: Diagnosis present

## 2019-08-23 DIAGNOSIS — Z3490 Encounter for supervision of normal pregnancy, unspecified, unspecified trimester: Secondary | ICD-10-CM | POA: Diagnosis present

## 2019-08-23 DIAGNOSIS — O99344 Other mental disorders complicating childbirth: Secondary | ICD-10-CM | POA: Diagnosis present

## 2019-08-23 DIAGNOSIS — Z30430 Encounter for insertion of intrauterine contraceptive device: Secondary | ICD-10-CM

## 2019-08-23 DIAGNOSIS — F410 Panic disorder [episodic paroxysmal anxiety] without agoraphobia: Secondary | ICD-10-CM | POA: Diagnosis present

## 2019-08-23 DIAGNOSIS — Z880 Allergy status to penicillin: Secondary | ICD-10-CM | POA: Diagnosis not present

## 2019-08-23 DIAGNOSIS — O4202 Full-term premature rupture of membranes, onset of labor within 24 hours of rupture: Secondary | ICD-10-CM | POA: Diagnosis not present

## 2019-08-23 DIAGNOSIS — O480 Post-term pregnancy: Secondary | ICD-10-CM | POA: Diagnosis present

## 2019-08-23 DIAGNOSIS — Z3480 Encounter for supervision of other normal pregnancy, unspecified trimester: Secondary | ICD-10-CM

## 2019-08-23 LAB — CBC
HCT: 36 % (ref 36.0–46.0)
Hemoglobin: 11.1 g/dL — ABNORMAL LOW (ref 12.0–15.0)
MCH: 24.9 pg — ABNORMAL LOW (ref 26.0–34.0)
MCHC: 30.8 g/dL (ref 30.0–36.0)
MCV: 80.7 fL (ref 80.0–100.0)
Platelets: 256 10*3/uL (ref 150–400)
RBC: 4.46 MIL/uL (ref 3.87–5.11)
RDW: 18.1 % — ABNORMAL HIGH (ref 11.5–15.5)
WBC: 10.2 10*3/uL (ref 4.0–10.5)

## 2019-08-23 LAB — SARS CORONAVIRUS 2 BY RT PCR (HOSPITAL ORDER, PERFORMED IN ~~LOC~~ HOSPITAL LAB): SARS Coronavirus 2: NEGATIVE

## 2019-08-23 LAB — TYPE AND SCREEN
ABO/RH(D): O POS
Antibody Screen: NEGATIVE

## 2019-08-23 MED ORDER — MISOPROSTOL 50MCG HALF TABLET
50.0000 ug | ORAL_TABLET | Freq: Once | ORAL | Status: AC
  Administered 2019-08-23: 50 ug via BUCCAL
  Filled 2019-08-23: qty 1

## 2019-08-23 MED ORDER — SOD CITRATE-CITRIC ACID 500-334 MG/5ML PO SOLN: 30.0000 mL | ORAL | Status: DC | PRN

## 2019-08-23 MED ORDER — OXYCODONE-ACETAMINOPHEN 5-325 MG PO TABS: 2.0000 | ORAL_TABLET | ORAL | Status: DC | PRN

## 2019-08-23 MED ORDER — OXYTOCIN BOLUS FROM INFUSION
333.0000 mL | Freq: Once | INTRAVENOUS | Status: AC
  Administered 2019-08-24: 333 mL via INTRAVENOUS

## 2019-08-23 MED ORDER — MISOPROSTOL 25 MCG QUARTER TABLET
25.0000 ug | ORAL_TABLET | ORAL | Status: DC | PRN
  Administered 2019-08-23: 25 ug via VAGINAL
  Filled 2019-08-23 (×2): qty 1

## 2019-08-23 MED ORDER — LACTATED RINGERS IV SOLN: 500.0000 mL | INTRAVENOUS | Status: DC | PRN

## 2019-08-23 MED ORDER — LIDOCAINE HCL (PF) 1 % IJ SOLN: 30.0000 mL | INTRAMUSCULAR | Status: DC | PRN

## 2019-08-23 MED ORDER — TERBUTALINE SULFATE 1 MG/ML IJ SOLN: 0.2500 mg | Freq: Once | INTRAMUSCULAR | Status: DC | PRN

## 2019-08-23 MED ORDER — MISOPROSTOL 50MCG HALF TABLET
50.0000 ug | ORAL_TABLET | Freq: Once | ORAL | Status: AC
  Administered 2019-08-24: 50 ug via BUCCAL
  Filled 2019-08-23: qty 1

## 2019-08-23 MED ORDER — ACETAMINOPHEN 325 MG PO TABS
650.0000 mg | ORAL_TABLET | ORAL | Status: DC | PRN
  Administered 2019-08-24: 650 mg via ORAL
  Filled 2019-08-23: qty 2

## 2019-08-23 MED ORDER — MISOPROSTOL 25 MCG QUARTER TABLET
25.0000 ug | ORAL_TABLET | Freq: Once | ORAL | Status: AC
  Administered 2019-08-23: 25 ug via BUCCAL

## 2019-08-23 MED ORDER — FENTANYL CITRATE (PF) 100 MCG/2ML IJ SOLN
100.0000 ug | INTRAMUSCULAR | Status: DC | PRN
  Administered 2019-08-24 (×2): 100 ug via INTRAVENOUS
  Filled 2019-08-23 (×2): qty 2

## 2019-08-23 MED ORDER — ONDANSETRON HCL 4 MG/2ML IJ SOLN: 4.0000 mg | Freq: Four times a day (QID) | INTRAMUSCULAR | Status: DC | PRN

## 2019-08-23 MED ORDER — OXYCODONE-ACETAMINOPHEN 5-325 MG PO TABS: 1.0000 | ORAL_TABLET | ORAL | Status: DC | PRN

## 2019-08-23 MED ORDER — OXYTOCIN-SODIUM CHLORIDE 30-0.9 UT/500ML-% IV SOLN: 2.5000 [IU]/h | INTRAVENOUS | Status: DC

## 2019-08-23 MED ORDER — LACTATED RINGERS IV SOLN: INTRAVENOUS | Status: DC

## 2019-08-23 NOTE — H&P (Signed)
OBSTETRIC ADMISSION HISTORY AND PHYSICAL  Brenda Mayo is a 24 y.o. female [name] with IUP at [redacted]w[redacted]d by early ultrasound presenting for IOL-PD. She reports +FMs, No LOF, no VB, no blurry vision, headaches or peripheral edema, and RUQ pain.  She plans on breast feeding. She request PP IUD for birth control. She received her prenatal care at [facility].   Dating: By Dot Been U/S --->  Estimated Date of Delivery: 08/23/19  Sono:    05/27/19 @[redacted]w[redacted]d , CWD, normal anatomy, cephalic presentation, 1007g, EFW   Prenatal History/Complications:  Sickle cell trait (FOB negative)  Past Medical History: Past Medical History:  Diagnosis Date   Anemia    Sickle cell trait (HCC)     Past Surgical History: Past Surgical History:  Procedure Laterality Date   NO PAST SURGERIES      Obstetrical History: OB History    Gravida  2   Para      Term      Preterm      AB  1   Living        SAB  1   TAB      Ectopic      Multiple      Live Births              Social History Social History   Socioeconomic History   Marital status: Single    Spouse name: Not on file   Number of children: Not on file   Years of education: Not on file   Highest education level: Not on file  Occupational History   Not on file  Tobacco Use   Smoking status: Never Smoker   Smokeless tobacco: Never Used  Vaping Use   Vaping Use: Never used  Substance and Sexual Activity   Alcohol use: Never   Drug use: Never   Sexual activity: Yes    Birth control/protection: None  Other Topics Concern   Not on file  Social History Narrative   Not on file   Social Determinants of Health   Financial Resource Strain:    Difficulty of Paying Living Expenses:   Food Insecurity:    Worried About 08% in the Last Year:    Programme researcher, broadcasting/film/video in the Last Year:   Transportation Needs:    Barista (Medical):    Lack of Transportation (Non-Medical):   Physical  Activity:    Days of Exercise per Week:    Minutes of Exercise per Session:   Stress:    Feeling of Stress :   Social Connections:    Frequency of Communication with Friends and Family:    Frequency of Social Gatherings with Friends and Family:    Attends Religious Services:    Active Member of Clubs or Organizations:    Attends Freight forwarder:    Marital Status:     Family History: Family History  Problem Relation Age of Onset   Sickle cell trait Mother     Allergies: Allergies  Allergen Reactions   Penicillins     Reports as child, does not know the reaction type.    Tomato Hives    Medications Prior to Admission  Medication Sig Dispense Refill Last Dose   ferrous sulfate 325 (65 FE) MG tablet Take 325 mg by mouth daily with breakfast.       prenatal vitamin w/FE, FA (PRENATAL 1 + 1) 27-1 MG TABS tablet Take 1 tablet by mouth daily at  12 noon.        Review of Systems   All systems reviewed and negative except as stated in HPI  Blood pressure 127/84, pulse (!) 106, resp. rate 18, height 5\' 10"  (1.778 m), weight 117.6 kg, last menstrual period 11/23/2018. General appearance: alert, cooperative and no distress Lungs: normal respiration Heart: regular rate and rhythm Abdomen: soft, non-tender; gravid Pelvic: as noted below Extremities: Homans sign is negative, no sign of DVT Presentation: cephalic by BSUS Fetal monitoringBaseline: 140 bpm, Variability: Good {> 6 bpm), Accelerations: Reactive and Decelerations: Absent Uterine activity irritable Dilation: Closed Effacement (%): Thick Station: -3 Exam by:: [email or web address] MD    Prenatal labs: ABO, Rh: --/--/PENDING (08/10 1103) Antibody: PENDING (08/10 1103) Rubella: 7.44 (04/06 0940) RPR: Non Reactive (05/24 0845)  HBsAg: Negative (04/06 0940)  HIV: Non Reactive (05/24 0845)  GBS: Negative/-- (07/12 0954)  2 hr Glucola passed Genetic screening  Low risk Anatomy 08-29-1971  unremarkable  Prenatal Transfer Tool  Maternal Diabetes: No Genetic Screening: Normal Maternal Ultrasounds/Referrals: Normal Fetal Ultrasounds or other Referrals:  None Maternal Substance Abuse:  No Significant Maternal Medications:  None Significant Maternal Lab Results: None  Results for orders placed or performed during the hospital encounter of 08/23/19 (from the past 24 hour(s))  Type and screen   Collection Time: 08/23/19 11:03 AM  Result Value Ref Range   ABO/RH(D) PENDING    Antibody Screen PENDING    Sample Expiration      08/26/2019,2359 Performed at [facility], [address], [place], [place]     Patient Active Problem List   Diagnosis Date Noted   Encounter for induction of labor 08/23/2019   Sickle cell trait in mother affecting pregnancy (HCC) 04/26/2019   Supervision of other normal pregnancy, antepartum 04/19/2019    Assessment/Plan:  Brenda Mayo is a 24 y.o. [name] at [redacted]w[redacted]d here for IOL-PD.  #IOL: Upon presentation 0/0/-3 on cervical exam, cephalic on BSUS. Initiate augmentation with cytotec. Continue to augment when appropriate. #Pain: desires CSE #FWB: Cat 1 #ID: GBS negative #MOF: breast #MOC: PP IUD #Circ: declines #Sickle cell trait: FOB negative  [redacted]w[redacted]d, MD  08/23/2019, 12:10 PM

## 2019-08-23 NOTE — Progress Notes (Signed)
Labor Progress Note Brenda Mayo is a 24 y.o. [name] at [redacted]w[redacted]d presented for IOL-PD. S: Doing well without complaints.  O:  BP 129/68   Pulse 95   Resp 18   Ht 5\' 10"  (1.778 m)   Wt 117.6 kg   LMP 11/23/2018 (Approximate)   BMI 37.19 kg/m  EFM: baseline 140bpm/mod variability/+accels/no decels Toco: irritable   CVE: Dilation: Closed Effacement (%): Thick Station: -3 Presentation: Vertex Exam by:: [email or web address] MD    [name]: 24 y.o. [name] [redacted]w[redacted]d presenting for IOL-PD.  #IOL: S/p cytotec x1, cervix softened, but exam unchanged, will proceed with another cytotec and continue augmentation as appropriate at this time. #Pain: PRN #FWB: cat 1 #GBS negative  [redacted]w[redacted]d, MD 4:16 PM

## 2019-08-23 NOTE — Progress Notes (Signed)
Introduced self to patient and discussed plan of care Answered patient's questions at this time Patient reports that she is starting to feel some intermittent contractions and cramping   Vitals:   08/23/19 1807 08/23/19 1959  BP: 135/71 116/61  Pulse: 91 (!) 105  Resp:    Temp: 98 F (36.7 C) 97.8 F (36.6 C)  SpO2: 100%    Dilation: Fingertip Effacement (%): Thick Station: -3 Presentation: Vertex Exam by:: Tacey Ruiz F RN  Cat I tracing  Plans nitrous oxide if she desires pain control prior to wanting epidural  Plan to recheck cervix around 2300 and place FB   Sharyon Cable, CNM 08/23/19, 8:54 PM

## 2019-08-23 NOTE — Progress Notes (Signed)
Labor Progress Note Brenda Mayo is a 24 y.o. [name] at [redacted]w[redacted]d presented for IOL-PD. S: Doing well without complaints. Starting to feel intermittent contractions.  O:  BP 135/71   Pulse 91   Temp 98 F (36.7 C) (Oral)   Resp 18   Ht 5\' 10"  (1.778 m)   Wt 117.6 kg   LMP 11/23/2018 (Approximate)   SpO2 100%   BMI 37.19 kg/m  EFM: baseline 145bpm/mod variability/+accels/no decels Toco: q1-5 min  CVE: Dilation: Fingertip Effacement (%): Thick Station: -3 Presentation: Vertex Exam by:: [email or web address] RN   [name]: 24 y.o. [name] [redacted]w[redacted]d presenting for IOL-PD.  #IOL: S/p cytotec x2, cervix softened, now fingertip, will proceed with another cytotec and continue augmentation as appropriate, consider FB at next check. #Pain: PRN #FWB: cat 1 #GBS negative  [redacted]w[redacted]d, MD 7:55 PM

## 2019-08-24 ENCOUNTER — Inpatient Hospital Stay (HOSPITAL_COMMUNITY): Payer: Medicaid Other | Admitting: Anesthesiology

## 2019-08-24 ENCOUNTER — Encounter (HOSPITAL_COMMUNITY): Payer: Self-pay | Admitting: Family Medicine

## 2019-08-24 DIAGNOSIS — Z30430 Encounter for insertion of intrauterine contraceptive device: Secondary | ICD-10-CM

## 2019-08-24 DIAGNOSIS — Z3A40 40 weeks gestation of pregnancy: Secondary | ICD-10-CM

## 2019-08-24 DIAGNOSIS — O4202 Full-term premature rupture of membranes, onset of labor within 24 hours of rupture: Secondary | ICD-10-CM

## 2019-08-24 DIAGNOSIS — O480 Post-term pregnancy: Secondary | ICD-10-CM

## 2019-08-24 LAB — RPR: RPR Ser Ql: NONREACTIVE

## 2019-08-24 MED ORDER — EPHEDRINE 5 MG/ML INJ: 10.0000 mg | INTRAVENOUS | Status: DC | PRN

## 2019-08-24 MED ORDER — BENZOCAINE-MENTHOL 20-0.5 % EX AERO
1.0000 "application " | INHALATION_SPRAY | CUTANEOUS | Status: DC | PRN
  Administered 2019-08-24: 1 via TOPICAL
  Filled 2019-08-24: qty 56

## 2019-08-24 MED ORDER — LIDOCAINE HCL (PF) 1 % IJ SOLN
INTRAMUSCULAR | Status: DC | PRN
  Administered 2019-08-24: 12 mL via EPIDURAL

## 2019-08-24 MED ORDER — ONDANSETRON HCL 4 MG PO TABS: 4.0000 mg | ORAL_TABLET | ORAL | Status: DC | PRN

## 2019-08-24 MED ORDER — PRENATAL MULTIVITAMIN CH
1.0000 | ORAL_TABLET | Freq: Every day | ORAL | Status: DC
  Administered 2019-08-25 – 2019-08-26 (×2): 1 via ORAL
  Filled 2019-08-24 (×2): qty 1

## 2019-08-24 MED ORDER — FENTANYL-BUPIVACAINE-NACL 0.5-0.125-0.9 MG/250ML-% EP SOLN
EPIDURAL | Status: AC
  Filled 2019-08-24: qty 250

## 2019-08-24 MED ORDER — IBUPROFEN 600 MG PO TABS
600.0000 mg | ORAL_TABLET | Freq: Four times a day (QID) | ORAL | Status: DC
  Administered 2019-08-24 – 2019-08-26 (×7): 600 mg via ORAL
  Filled 2019-08-24 (×7): qty 1

## 2019-08-24 MED ORDER — WITCH HAZEL-GLYCERIN EX PADS: 1.0000 "application " | MEDICATED_PAD | CUTANEOUS | Status: DC | PRN

## 2019-08-24 MED ORDER — SODIUM CHLORIDE (PF) 0.9 % IJ SOLN
INTRAMUSCULAR | Status: DC | PRN
  Administered 2019-08-24: 12 mL/h via EPIDURAL

## 2019-08-24 MED ORDER — LACTATED RINGERS AMNIOINFUSION: INTRAVENOUS | Status: DC

## 2019-08-24 MED ORDER — ONDANSETRON HCL 4 MG/2ML IJ SOLN: 4.0000 mg | INTRAMUSCULAR | Status: DC | PRN

## 2019-08-24 MED ORDER — PHENYLEPHRINE 40 MCG/ML (10ML) SYRINGE FOR IV PUSH (FOR BLOOD PRESSURE SUPPORT)
80.0000 ug | PREFILLED_SYRINGE | INTRAVENOUS | Status: DC | PRN
  Filled 2019-08-24: qty 10

## 2019-08-24 MED ORDER — COCONUT OIL OIL: 1.0000 "application " | TOPICAL_OIL | Status: DC | PRN

## 2019-08-24 MED ORDER — SENNOSIDES-DOCUSATE SODIUM 8.6-50 MG PO TABS
2.0000 | ORAL_TABLET | ORAL | Status: DC
  Administered 2019-08-25: 2 via ORAL
  Filled 2019-08-24 (×2): qty 2

## 2019-08-24 MED ORDER — PHENYLEPHRINE 40 MCG/ML (10ML) SYRINGE FOR IV PUSH (FOR BLOOD PRESSURE SUPPORT): 80.0000 ug | PREFILLED_SYRINGE | INTRAVENOUS | Status: DC | PRN

## 2019-08-24 MED ORDER — DIPHENHYDRAMINE HCL 25 MG PO CAPS: 25.0000 mg | ORAL_CAPSULE | Freq: Four times a day (QID) | ORAL | Status: DC | PRN

## 2019-08-24 MED ORDER — OXYTOCIN-SODIUM CHLORIDE 30-0.9 UT/500ML-% IV SOLN: 1.0000 m[IU]/min | INTRAVENOUS | Status: DC

## 2019-08-24 MED ORDER — DIBUCAINE (PERIANAL) 1 % EX OINT: 1.0000 "application " | TOPICAL_OINTMENT | CUTANEOUS | Status: DC | PRN

## 2019-08-24 MED ORDER — LACTATED RINGERS IV SOLN: 500.0000 mL | Freq: Once | INTRAVENOUS | Status: DC

## 2019-08-24 MED ORDER — DIPHENHYDRAMINE HCL 50 MG/ML IJ SOLN: 12.5000 mg | INTRAMUSCULAR | Status: DC | PRN

## 2019-08-24 MED ORDER — OXYTOCIN-SODIUM CHLORIDE 30-0.9 UT/500ML-% IV SOLN
1.0000 m[IU]/min | INTRAVENOUS | Status: DC
  Administered 2019-08-24: 2 m[IU]/min via INTRAVENOUS
  Administered 2019-08-24: 1 m[IU]/min via INTRAVENOUS
  Filled 2019-08-24: qty 500

## 2019-08-24 MED ORDER — DOCUSATE SODIUM 100 MG PO CAPS
100.0000 mg | ORAL_CAPSULE | Freq: Two times a day (BID) | ORAL | Status: DC
  Administered 2019-08-25 – 2019-08-26 (×2): 100 mg via ORAL
  Filled 2019-08-24 (×3): qty 1

## 2019-08-24 MED ORDER — SIMETHICONE 80 MG PO CHEW: 80.0000 mg | CHEWABLE_TABLET | ORAL | Status: DC | PRN

## 2019-08-24 MED ORDER — LEVONORGESTREL 19.5 MCG/DAY IU IUD
INTRAUTERINE_SYSTEM | Freq: Once | INTRAUTERINE | Status: AC
  Administered 2019-08-24: 1 via INTRAUTERINE
  Filled 2019-08-24: qty 1

## 2019-08-24 MED ORDER — ACETAMINOPHEN 325 MG PO TABS
650.0000 mg | ORAL_TABLET | ORAL | Status: DC | PRN
  Administered 2019-08-25: 650 mg via ORAL
  Filled 2019-08-24: qty 2

## 2019-08-24 MED ORDER — TETANUS-DIPHTH-ACELL PERTUSSIS 5-2.5-18.5 LF-MCG/0.5 IM SUSP: 0.5000 mL | Freq: Once | INTRAMUSCULAR | Status: DC

## 2019-08-24 MED ORDER — FENTANYL-BUPIVACAINE-NACL 0.5-0.125-0.9 MG/250ML-% EP SOLN: 12.0000 mL/h | EPIDURAL | Status: DC | PRN

## 2019-08-24 MED ORDER — TERBUTALINE SULFATE 1 MG/ML IJ SOLN: 0.2500 mg | Freq: Once | INTRAMUSCULAR | Status: DC | PRN

## 2019-08-24 NOTE — Progress Notes (Signed)
LABOR PROGRESS NOTE  Brenda Mayo is a 24 y.o. [name] at [redacted]w[redacted]d  admitted for IOL for PD   Subjective: Patient doing well, says she is very happy w her birthing experience thus far. Feels urge to push intermittently.   Objective: BP 140/64   Pulse 99   Temp 99.2 F (37.3 C) (Axillary)   Resp 18   Ht 5\' 10"  (1.778 m)   Wt 117.6 kg   LMP 11/23/2018 (Approximate)   SpO2 100%   BMI 37.19 kg/m  or  Vitals:   08/24/19 1030 08/24/19 1100 08/24/19 1108 08/24/19 1132  BP: (!) 89/63 99/74  140/64  Pulse: 87 85  99  Resp: 17 16 17 18   Temp:   99.2 F (37.3 C)   TempSrc:   Axillary   SpO2:      Weight:      Height:         Dilation: 8 Effacement (%): 90, 100 Station: -1, 0 Presentation: Vertex Exam by:: stone rnc FHT: baseline rate 120, moderate varibility, +accel, no decel Toco: 1 min  Labs: Lab Results  Component Value Date   WBC 10.2 08/23/2019   HGB 11.1 (L) 08/23/2019   HCT 36.0 08/23/2019   MCV 80.7 08/23/2019   PLT 256 08/23/2019    Patient Active Problem List   Diagnosis Date Noted  . Encounter for induction of labor 08/23/2019  . Sickle cell trait in mother affecting pregnancy (HCC) 04/26/2019  . Supervision of other normal pregnancy, antepartum 04/19/2019    Assessment / Plan: 24 y.o. [name] at [redacted]w[redacted]d here for IOL for PD   Labor: Pitocin started @ 0600, making steady change. Anticipate SVD   Fetal Wellbeing:  Cat I  Pain Control:  Epidural  Anticipated MOD:  SVD  Contraception: PP IUD ordered and consented   25, MD 08/24/2019, 11:44 AM

## 2019-08-24 NOTE — Procedures (Signed)
  Post-Placental IUD Insertion Procedure Note  Patient identified, informed consent signed prior to delivery, signed copy in chart, time out was performed.    Vaginal, labial and perineal areas thoroughly inspected for lacerations. First degree laceration identified - not repaired prior to insertion of IUD.     Liletta IUD grasped between sterile gloved fingers. Sterile lubrication applied to sterile gloved hand for ease of insertion. Fundus identified through abdominal wall using non-insertion hand. IUD inserted to fundus with bimanual technique. IUD carefully released at the fundus and insertion hand gently removed from vagina.    Strings trimmed to the level of the introitus. Patient tolerated procedure well.  Lot # [id number] Expiration Date 12/14/22  Patient given post procedure instructions and IUD care card with expiration date.  Patient is asked to keep IUD strings tucked in her vagina until her postpartum follow up visit in 4-6 weeks. Patient advised to abstain from sexual intercourse and pulling on strings before her follow-up visit. Patient verbalized an understanding of the plan of care and agrees.     Casper Harrison, MD [facility] Family Medicine Fellow, [facility] for [facility], [facility]

## 2019-08-24 NOTE — Discharge Summary (Addendum)
Postpartum Discharge Summary     Patient Name: Brenda Mayo DOB: May 12, 1995 MRN: [id number]  Date of admission: 08/23/2019 Delivery date:08/24/2019  Delivering provider: Janet Berlin  Date of discharge: 08/26/2019  Admitting diagnosis: Encounter for induction of labor [Z34.90] Intrauterine pregnancy: [redacted]w[redacted]d    Secondary diagnosis:  Active Problems:   Encounter for induction of labor  Additional problems: N/A    Discharge diagnosis: Term Pregnancy Delivered                                              Post partum procedures:  PP IUD  Augmentation: AROM, Pitocin and Cytotec Complications: None  Hospital course: Induction of Labor With Vaginal Delivery   24y.o. yo G2P1011 at 486w1das admitted to the hospital 08/23/2019 for induction of labor.  Indication for induction: Postdates.  Patient had an uncomplicated labor course as follows: Membrane Rupture Time/Date: 9:36 PM ,08/23/2019   Delivery Method:Vaginal, Spontaneous  Episiotomy: None  Lacerations:  1st degree  Details of delivery can be found in separate delivery note.  Patient had a routine postpartum course. Patient is discharged home 08/26/19.  Newborn Data: Birth date:08/24/2019  Birth time:3:37 PM  Gender:Female  Living status:Living  Apgars:8 ,9  Weight:3720 g   Magnesium Sulfate received: No BMZ received: No Rhophylac:N/A MMR:N/A T-DaP: declined Flu: No Transfusion:No  Physical exam  Vitals:   08/25/19 0125 08/25/19 0508 08/25/19 1410 08/26/19 0551  BP: 123/67 126/69 122/82 118/74  Pulse: (!) 104 90 (!) 110 94  Resp: 19 18 18 18   Temp: 98.9 F (37.2 C) 98.9 F (37.2 C) 98.7 F (37.1 C) 98.4 F (36.9 C)  TempSrc: Oral Oral Oral Oral  SpO2: 99% 100%  100%  Weight:      Height:       General: alert, cooperative and no distress Lochia: appropriate Uterine Fundus: firm Incision: N/A DVT Evaluation: No evidence of DVT seen on physical exam. Labs: Lab Results  Component Value Date   WBC 10.2  08/23/2019   HGB 11.1 (L) 08/23/2019   HCT 36.0 08/23/2019   MCV 80.7 08/23/2019   PLT 256 08/23/2019   No flowsheet data found. Edinburgh Score: Edinburgh Postnatal Depression Scale Screening Tool 08/24/2019  I have been able to laugh and see the funny side of things. 0  I have looked forward with enjoyment to things. 1  I have blamed myself unnecessarily when things went wrong. 0  I have been anxious or worried for no good reason. 2  I have felt scared or panicky for no good reason. 1  Things have been getting on top of me. 0  I have been so unhappy that I have had difficulty sleeping. 0  I have felt sad or miserable. 1  I have been so unhappy that I have been crying. 0  The thought of harming myself has occurred to me. 0  Edinburgh Postnatal Depression Scale Total 5     After visit meds:  Allergies as of 08/26/2019       Reactions   Penicillins    Reports as child, does not know the reaction type.    Tomato Hives        Medication List     STOP taking these medications    ferrous sulfate 325 (65 FE) MG tablet       TAKE these  medications    ibuprofen 600 MG tablet Commonly known as: ADVIL Take 1 tablet (600 mg total) by mouth every 6 (six) hours as needed.   prenatal vitamin w/FE, FA 27-1 MG Tabs tablet Take 1 tablet by mouth daily at 12 noon.         Discharge home in stable condition Infant Feeding: Breast Infant Disposition:home with mother Discharge instruction: per After Visit Summary and Postpartum booklet. Activity: Advance as tolerated. Pelvic rest for 6 weeks.  Diet: routine diet Future Appointments: Future Appointments  Date Time Provider [place]  10/07/2019 10:30 AM Truett Mainland, DO [facility] None   Follow up Visit:    Please schedule this patient for a In person postpartum visit in 6 weeks with the following provider: Any provider. Additional Postpartum F/U: None   Low risk pregnancy complicated by:  None Delivery  mode:  Vaginal, Spontaneous  Anticipated Birth Control:  PP IUD placed Brenda Mayo)    08/26/2019 Matilde Haymaker, MD  GME ATTESTATION:  I saw and evaluated the patient. I agree with the findings and the plan of care as documented in the resident's note.  Merilyn Baba, DO OB Fellow, [place] for [place] 08/26/2019 8:39 AM

## 2019-08-24 NOTE — Anesthesia Preprocedure Evaluation (Signed)
Anesthesia Evaluation  Patient identified by MRN, date of birth, ID band Patient awake    Reviewed: Allergy & Precautions, NPO status , Patient's Chart, lab work & pertinent test results  Airway Mallampati: II  TM Distance: >3 FB Neck ROM: Full    Dental no notable dental hx.    Pulmonary neg pulmonary ROS,    Pulmonary exam normal breath sounds clear to auscultation       Cardiovascular negative cardio ROS Normal cardiovascular exam Rhythm:Regular Rate:Normal     Neuro/Psych negative neurological ROS  negative psych ROS   GI/Hepatic negative GI ROS, Neg liver ROS,   Endo/Other  negative endocrine ROSObese BMI 37  Renal/GU negative Renal ROS  negative genitourinary   Musculoskeletal negative musculoskeletal ROS (+)   Abdominal   Peds  Hematology  (+) Blood dyscrasia, Sickle cell trait and anemia ,   Anesthesia Other Findings IOL for post dates  Reproductive/Obstetrics (+) Pregnancy                             Anesthesia Physical Anesthesia Plan  ASA: II  Anesthesia Plan: Epidural   Post-op Pain Management:    Induction:   PONV Risk Score and Plan: Treatment may vary due to age or medical condition  Airway Management Planned: Natural Airway  Additional Equipment:   Intra-op Plan:   Post-operative Plan:   Informed Consent: I have reviewed the patients History and Physical, chart, labs and discussed the procedure including the risks, benefits and alternatives for the proposed anesthesia with the patient or authorized representative who has indicated his/her understanding and acceptance.       Plan Discussed with: Anesthesiologist  Anesthesia Plan Comments: (Patient identified. Risks, benefits, options discussed with patient including but not limited to bleeding, infection, nerve damage, paralysis, failed block, incomplete pain control, headache, blood pressure changes,  nausea, vomiting, reactions to medication, itching, and post partum back pain. Confirmed with bedside nurse the patient's most recent platelet count. Confirmed with the patient that they are not taking any anticoagulation, have any bleeding history or any family history of bleeding disorders. Patient expressed understanding and wishes to proceed. All questions were answered. )        Anesthesia Quick Evaluation

## 2019-08-24 NOTE — Progress Notes (Signed)
LABOR PROGRESS NOTE  Brenda Mayo is a 24 y.o. [name] at [redacted]w[redacted]d  admitted for IOL for PD   Subjective: Patient very anxious and currently having panic attack d/t FSE coming off and not being able to hear FHR, comforted patient    Objective: BP 135/63   Pulse 85   Temp 97.9 F (36.6 C) (Oral)   Resp 16   Ht 5\' 10"  (1.778 m)   Wt 117.6 kg   LMP 11/23/2018 (Approximate)   SpO2 100%   BMI 37.19 kg/m  or  Vitals:   08/24/19 0350 08/24/19 0401 08/24/19 0502 08/24/19 0601  BP: 135/78 133/71 (!) 128/59 135/63  Pulse: 97 92 93 85  Resp: 16 17 18 16   Temp:  97.9 F (36.6 C)    TempSrc:  Oral    SpO2:      Weight:      Height:        FSE replaced  Dilation: 4 Effacement (%): 80 Station: -2 Presentation: Vertex Exam by:: CNM FHT: baseline rate 130, moderate varibility, +accel, no decel Toco: 2-6  Labs: Lab Results  Component Value Date   WBC 10.2 08/23/2019   HGB 11.1 (L) 08/23/2019   HCT 36.0 08/23/2019   MCV 80.7 08/23/2019   PLT 256 08/23/2019    Patient Active Problem List   Diagnosis Date Noted  . Encounter for induction of labor 08/23/2019  . Sickle cell trait in mother affecting pregnancy (HCC) 04/26/2019  . Supervision of other normal pregnancy, antepartum 04/19/2019    Assessment / Plan: 24 y.o. [name] at [redacted]w[redacted]d here for IOL for PD   Labor: Pitocin started @ 0600 Fetal Wellbeing:  Cat I  Pain Control:  Epidural  Anticipated MOD:  SVD  25, CNM 08/24/2019, 6:10 AM

## 2019-08-24 NOTE — Lactation Note (Signed)
This note was copied from a baby's chart. Lactation Consultation Note  Patient Name: Brenda Mayo [id number] Date: 08/24/2019 Reason for consult: Initial assessment;1st time breastfeeding;Term P1, 7 hour term female infant. Per mom, infant latched well for 15 minutes in L&D. Per mom, she brought her Person DEBP with her to the hospital.  Per mom, she attempted to latch infant few minutes ago in cradle hold but infant would not sustain latch. LC asked dad undress infant, mom latched infant on her left breast using the football hold position. LC ask mom bring infant towards her breast, rub nipple below infant's  nose, infant latched with nose and chin touching breast, top lip flanged outward. Infant breastfeed for 12 minutes. Mom understands to BF infant according to hunger cues, on demand, 8 to 12 times within 24 hours period. Parents will do as much STS as possible with infant.  Mom knows to call RN or LC if she has questions, concerns or needs assistance with latching infant at breast. Reviewed Baby & Me book's Breastfeeding Basics.  Mom made aware of O/P services, breastfeeding support groups, community resources, and our phone # for post-discharge questions.   Maternal Data Formula Feeding for Exclusion: No Has patient been taught Hand Expression?: Yes  Feeding Feeding Type: Breast Fed  LATCH Score Latch: Grasps breast easily, tongue down, lips flanged, rhythmical sucking.  Audible Swallowing: Spontaneous and intermittent  Type of Nipple: Everted at rest and after stimulation  Comfort (Breast/Nipple): Soft / non-tender  Hold (Positioning): Assistance needed to correctly position infant at breast and maintain latch.  LATCH Score: 9  Interventions Interventions: Breast feeding basics reviewed;Assisted with latch;Breast compression;Skin to skin;Adjust position;Breast massage;Support pillows;Hand express;Position options  Lactation Tools Discussed/Used WIC Program:  No   Consult Status Consult Status: Follow-up Date: 08/25/19 Follow-up type: In-patient    Danelle Earthly 08/24/2019, 10:41 PM

## 2019-08-24 NOTE — Anesthesia Procedure Notes (Signed)
Epidural Patient location during procedure: OB Start time: 08/24/2019 2:55 AM End time: 08/24/2019 3:05 AM  Staffing Anesthesiologist: Elmer Picker, MD Performed: anesthesiologist   Preanesthetic Checklist Completed: patient identified, IV checked, risks and benefits discussed, monitors and equipment checked, pre-op evaluation and timeout performed  Epidural Patient position: sitting Prep: DuraPrep and site prepped and draped Patient monitoring: continuous pulse ox, blood pressure, heart rate and cardiac monitor Approach: midline Location: L3-L4 Injection technique: LOR air  Needle:  Needle type: Tuohy  Needle gauge: 17 G Needle length: 9 cm Needle insertion depth: 6 cm Catheter type: closed end flexible Catheter size: 19 Gauge Catheter at skin depth: 12 cm Test dose: negative  Assessment Sensory level: T8 Events: blood not aspirated, injection not painful, no injection resistance, no paresthesia and negative IV test  Additional Notes Patient identified. Risks/Benefits/Options discussed with patient including but not limited to bleeding, infection, nerve damage, paralysis, failed block, incomplete pain control, headache, blood pressure changes, nausea, vomiting, reactions to medication both or allergic, itching and postpartum back pain. Confirmed with bedside nurse the patient's most recent platelet count. Confirmed with patient that they are not currently taking any anticoagulation, have any bleeding history or any family history of bleeding disorders. Patient expressed understanding and wished to proceed. All questions were answered. Sterile technique was used throughout the entire procedure. Please see nursing notes for vital signs. Test dose was given through epidural catheter and negative prior to continuing to dose epidural or start infusion. Warning signs of high block given to the patient including shortness of breath, tingling/numbness in hands, complete motor block,  or any concerning symptoms with instructions to call for help. Patient was given instructions on fall risk and not to get out of bed. All questions and concerns addressed with instructions to call with any issues or inadequate analgesia.  Reason for block:procedure for pain

## 2019-08-24 NOTE — Progress Notes (Signed)
Called to bedside by RN @ [phone] for prolonged deceleration now into 6 minutes   Upon entering room patient was in hands/knees position and pitocin was off.  Immediately asked for FSE and IUPC, patient position to side lying for placement  Ordered oxygen through non rebreather   Dilation: 3.5 Effacement (%): 50 Station: -2 Presentation: Vertex Exam by:: Steward Drone, CNM  FHR returned to baseline after interventions and total deceleration of 9 minutes  FHR 135 @0446   Continued to stay at bedside to monitor through 0504. Oxygen removed prior to leaving room and amnioinfusion ordered.  Patient in right exaggerated sims with peanut.   Dr called @0512  to notify of deceleration and update on plan of care  Plan to hold pitocin at this time, reassess MVU and FHR around 0545.   Adrian Blackwater, CNM 08/24/19, 5:22 AM

## 2019-08-24 NOTE — Progress Notes (Signed)
LABOR PROGRESS NOTE  Brenda Mayo is a 24 y.o. [name] at [redacted]w[redacted]d  admitted for IOL PD   Subjective: Patient breathing through contractions, rates pain 7.5/10 but declines any pain medication at this time   Objective: BP (!) 143/77   Pulse 88   Temp 97.9 F (36.6 C) (Oral)   Resp 17   Ht 5\' 10"  (1.778 m)   Wt 117.6 kg   LMP 11/23/2018 (Approximate)   SpO2 100%   BMI 37.19 kg/m  or  Vitals:   08/23/19 1959 08/23/19 2135 08/23/19 2241 08/23/19 2327  BP: 116/61 (!) 147/78 (!) 144/79 (!) 143/77  Pulse: (!) 105 (!) 105 97 88  Resp:  18 17   Temp: 97.8 F (36.6 C)   97.9 F (36.6 C)  TempSrc:    Oral  SpO2:      Weight:      Height:        SROM @2136 , clear fluid  Dilation: 1 Effacement (%): 50 Station: -3 Presentation: Vertex Exam by:: CNM FHT: baseline rate 140, moderate varibility, +accel, no decel Toco: 2-3, mild by palpation   Labs: Lab Results  Component Value Date   WBC 10.2 08/23/2019   HGB 11.1 (L) 08/23/2019   HCT 36.0 08/23/2019   MCV 80.7 08/23/2019   PLT 256 08/23/2019    Patient Active Problem List   Diagnosis Date Noted  . Encounter for induction of labor 08/23/2019  . Sickle cell trait in mother affecting pregnancy (HCC) 04/26/2019  . Supervision of other normal pregnancy, antepartum 04/19/2019    Assessment / Plan: 24 y.o. [name] at [redacted]w[redacted]d here for IOL for PD   Labor: failed FB attempt. Discussed plan of care and options for either continuation with cytotec for 1 more dose or initiation with pitocin. Patient request one more dose of cytotec then progress to pitocin @ 0400.  Fetal Wellbeing:  Cat I  Pain Control:  PRN meds as needed  Anticipated MOD:  SVD  25, CNM 08/24/2019, 12:46 AM

## 2019-08-25 NOTE — Progress Notes (Addendum)
Post Partum Day 1  Subjective:  Brenda Mayo is a 24 y.o. G2P1011 [redacted]w[redacted]d s/p SVD.  No acute events overnight.  Pt denies problems with ambulating, voiding or po intake.  She denies nausea or vomiting.  Pain is well controlled.  She has had flatus. She has not had bowel movement.  Lochia Small.  Plan for birth control is IUD.  Method of Feeding: Breast  Objective: BP 126/69 (BP Location: Left Arm)   Pulse 90   Temp 98.9 F (37.2 C) (Oral)   Resp 18   Ht 5\' 10"  (1.778 m)   Wt 117.6 kg   LMP 11/23/2018 (Approximate)   SpO2 100%   Breastfeeding Unknown   BMI 37.19 kg/m   Physical Exam:  General: alert, cooperative and no distress Lochia:normal flow Chest: CTAB Heart: RRR no m/r/g Abdomen: +BS, soft, nontender, fundus firm at/below umbilicus Uterine Fundus: firm, below umbilicus DVT Evaluation: No evidence of DVT seen on physical exam. Extremities: no edema  Recent Labs    08/23/19 1052  HGB 11.1*  HCT 36.0    Assessment/Plan:  ASSESSMENT: Brenda Mayo is a 24 y.o. G2P1011 [redacted]w[redacted]d ppd #1 s/p NSVD doing well.   Plan for discharge tomorrow, Breastfeeding and Contraception PPIUD placed   LOS: 2 days   [redacted]w[redacted]d 08/25/2019, 8:03 AM   GME ATTESTATION:  I saw and evaluated the patient. I agree with the findings and the plan of care as documented in the resident's note.  10/25/2019, DO OB Fellow, Faculty [facility], [facility] 08/25/2019 9:06 AM

## 2019-08-25 NOTE — Anesthesia Postprocedure Evaluation (Signed)
Anesthesia Post Note  Patient: [name]  Procedure(s) Performed: AN AD HOC LABOR EPIDURAL     Patient location during evaluation: Mother Baby Anesthesia Type: Epidural Level of consciousness: awake and alert Pain management: pain level controlled Vital Signs Assessment: post-procedure vital signs reviewed and stable Respiratory status: spontaneous breathing, nonlabored ventilation and respiratory function stable Cardiovascular status: stable Postop Assessment: no headache, no backache and epidural receding Anesthetic complications: no Comments: Per telephone conversation   No complications documented.  Last Vitals:  Vitals:   08/25/19 0125 08/25/19 0508  BP: 123/67 126/69  Pulse: (!) 104 90  Resp: 19 18  Temp: 37.2 C 37.2 C  SpO2: 99% 100%    Last Pain:  Vitals:   08/25/19 0508  TempSrc: Oral  PainSc:    Pain Goal:                   Trellis Paganini

## 2019-08-26 MED ORDER — SIMETHICONE 80 MG PO CHEW: 80.0000 mg | CHEWABLE_TABLET | ORAL | 0 refills | Status: DC | PRN

## 2019-08-26 MED ORDER — POLYETHYLENE GLYCOL 3350 17 GM/SCOOP PO POWD: 17.0000 g | Freq: Every day | ORAL | 0 refills | Status: DC

## 2019-08-26 MED ORDER — IBUPROFEN 600 MG PO TABS: 600.0000 mg | ORAL_TABLET | Freq: Four times a day (QID) | ORAL | 0 refills | Status: DC | PRN

## 2019-08-26 NOTE — Lactation Note (Signed)
This note was copied from a baby's chart. Lactation Consultation Note  Patient Name: [name] [id number] Date: 08/26/2019 Reason for consult: Follow-up assessment;Mother's request;Primapara;Term;Hyperbilirubinemia   Infant is 41 hours old 40 weeks. Mom started formula concerned the infant was dehydrated. No clinical signs of dehydration noted by staff.   Mom at the time of visit had the infant latched but he was not active. Educated Mom on how to keep the infant active at the breast and LC assisted with detaching the infant. Demonstrated to the Mom the compression stripe on the Right nipple indicated latch was shallow. There was some redness but no other signs of abrasion of the nipple. Mom currently using nipple butter for care. Education on delaying the use of the pacifier provided.   Demonstrated with Mom how to do hand expression. Provided education on how to spoon feed expressed breast milk. Mom has an electric pump. LC assembled and demonstrated the use of the manual pump. Mom sized with the 27 flange. Pump assembly, cleaning, and milk storage reviewed with Mom. Mom also provided education on engorgement education, prevention and treatment given.   Mom given the brochure with contact information for outpatient lactation services and educational classes.              Interventions    Lactation Tools Discussed/Used Pump Review: Setup, frequency, and cleaning;Milk Storage Initiated by::  Bobetta Lime ) Date initiated:: 08/26/19   Consult Status Consult Status: Complete Date: 08/26/19    Ricci Paff  Nicholson-Springer 08/26/2019, 9:29 AM

## 2019-09-22 ENCOUNTER — Encounter (HOSPITAL_COMMUNITY): Payer: Self-pay | Admitting: Obstetrics & Gynecology

## 2019-09-22 ENCOUNTER — Other Ambulatory Visit: Payer: Self-pay

## 2019-09-22 ENCOUNTER — Inpatient Hospital Stay (HOSPITAL_COMMUNITY)
Admission: AD | Admit: 2019-09-22 | Discharge: 2019-09-22 | Disposition: A | Discharge status: Home / Self Care | Payer: Medicaid Other | Attending: Obstetrics & Gynecology | Admitting: Obstetrics & Gynecology

## 2019-09-22 DIAGNOSIS — O99893 Other specified diseases and conditions complicating puerperium: Secondary | ICD-10-CM | POA: Diagnosis not present

## 2019-09-22 DIAGNOSIS — Z30431 Encounter for routine checking of intrauterine contraceptive device: Secondary | ICD-10-CM

## 2019-09-22 DIAGNOSIS — D573 Sickle-cell trait: Secondary | ICD-10-CM | POA: Insufficient documentation

## 2019-09-22 DIAGNOSIS — Z975 Presence of (intrauterine) contraceptive device: Secondary | ICD-10-CM | POA: Insufficient documentation

## 2019-09-22 DIAGNOSIS — Z880 Allergy status to penicillin: Secondary | ICD-10-CM | POA: Diagnosis not present

## 2019-09-22 NOTE — MAU Provider Note (Signed)
History     CSN: [id number]  Arrival date and time: 09/22/19 1413   First Provider Initiated Contact with Patient 09/22/19 1605      Chief Complaint  Patient presents with  . IUD removal   Ms. Tyishia Aune is a 24 y.o. G2P1011 at ppartum who presents to MAU for bleeding not more than spotting when wiping, intermittent cramping and feeling like her IUD is falling out. Patient reports she feels like her IUD is falling out because she feels like her organs are falling out of her body.   OB History    Gravida  2   Para  1   Term  1   Preterm      AB  1   Living  1     SAB  1   TAB      Ectopic      Multiple  0   Live Births  1           Past Medical History:  Diagnosis Date  . Anemia   . Sickle cell trait [facility])     Past Surgical History:  Procedure Laterality Date  . NO PAST SURGERIES      Family History  Problem Relation Age of Onset  . Sickle cell trait Mother     Social History   Tobacco Use  . Smoking status: Never Smoker  . Smokeless tobacco: Never Used  Vaping Use  . Vaping Use: Never used  Substance Use Topics  . Alcohol use: Never  . Drug use: Never    Allergies:  Allergies  Allergen Reactions  . Penicillins     Reports as child, does not know the reaction type.   . Tomato Hives    No medications prior to admission.    Review of Systems  Constitutional: Negative for chills, diaphoresis, fatigue and fever.  Eyes: Negative for visual disturbance.  Respiratory: Negative for shortness of breath.   Cardiovascular: Negative for chest pain.  Gastrointestinal: Negative for abdominal pain, constipation, diarrhea, nausea and vomiting.  Genitourinary: Positive for pelvic pain and vaginal bleeding. Negative for dysuria, flank pain, frequency, urgency and vaginal discharge.  Neurological: Negative for dizziness, weakness, light-headedness and headaches.   Physical Exam   Blood pressure 96/70, pulse 82, temperature 98.2  F (36.8 C), temperature source Oral, resp. rate 18, height 5\' 10"  (1.778 m), weight 104.8 kg, SpO2 100 %, unknown if currently breastfeeding.  Patient Vitals for the past 24 hrs:  BP Temp Temp src Pulse Resp SpO2 Height Weight  09/22/19 1614 96/70 98.2 F (36.8 C) Oral 82 18 100 % -- --  09/22/19 1508 114/72 98.3 F (36.8 C) Oral 80 20 100 % -- --  09/22/19 1503 -- -- -- -- -- -- 5\' 10"  (1.778 m) 104.8 kg   Physical Exam Vitals and nursing note reviewed. Exam conducted with a chaperone present.  Constitutional:      General: She is not in acute distress.    Appearance: Normal appearance. She is not ill-appearing, toxic-appearing or diaphoretic.  HENT:     Head: Normocephalic and atraumatic.  Pulmonary:     Effort: Pulmonary effort is normal.  Abdominal:     Palpations: Abdomen is soft.  Genitourinary:    General: Normal vulva.     Labia:        Right: No rash, tenderness or lesion.        Left: No rash, tenderness or lesion.      Comments: Patient  unable to tolerate speculum exam. Minimal bleeding present at introitus. Patient able to tolerate bimanual exam. IUD strings palpated, but body of IUD not palpated at os. Neurological:     Mental Status: She is alert and oriented to person, place, and time.  Psychiatric:        Mood and Affect: Mood normal.        Behavior: Behavior normal.        Thought Content: Thought content normal.        Judgment: Judgment normal.     MAU Course  Procedures  MDM -Patient unable to tolerate speculum exam. Minimal bleeding present at introitus. Patient able to tolerate bimanual exam. IUD strings palpated, but body of IUD not palpated at os. -IUD in place based on bimanual exam -pt discharged to home in stable condition  Assessment and Plan   1. IUD (intrauterine device) in place   2. Postpartum state     Allergies as of 09/22/2019      Reactions   Penicillins    Reports as child, does not know the reaction type.    Tomato Hives       Medication List    TAKE these medications   ibuprofen 600 MG tablet Commonly known as: ADVIL Take 1 tablet (600 mg total) by mouth every 6 (six) hours as needed.   polyethylene glycol powder 17 GM/SCOOP powder Commonly known as: GLYCOLAX/MIRALAX Take 17 g by mouth daily.   prenatal vitamin w/FE, FA 27-1 MG Tabs tablet Take 1 tablet by mouth daily at 12 noon.   simethicone 80 MG chewable tablet Commonly known as: MYLICON Chew 1 tablet (80 mg total) by mouth as needed for flatulence.       -discussed normal expectations approximately 4 weeks ppartum -discussed warning signs of IUD expulsion/perforation/bleeding -pt advised she will need speculum exam at ppartum visit for trimming of strings and full evaluation of IUD -return MAU precautions given -pt discharged to home in stable condition  Joni Reining E Bram Hottel 09/22/2019, 4:53 PM

## 2019-09-22 NOTE — Discharge Instructions (Signed)
Intrauterine Device Insertion An intrauterine device (IUD) is a medical device that gets inserted into the uterus to prevent pregnancy. It is a small, T-shaped device that has one or two nylon strings hanging down from it. The strings hang out of the lower part of the uterus (cervix) to allow for future IUD removal. There are two types of IUDs available:  Copper IUD. This type of IUD has copper wire wrapped around it. Copper makes the uterus and fallopian tubes produce a fluid that kills sperm. A copper IUD may last up to 10 years.  Hormone IUD. This type of IUD is made of plastic and contains the hormone progestin (synthetic progesterone). The hormone thickens mucus in the cervix and prevents sperm from entering the uterus. It also thins the uterine lining to prevent implantation of a fertilized egg. The hormone can weaken or kill the sperm that get into the uterus. A hormone IUD may last 3-5 years. Tell a health care provider about:  Any allergies you have.  All medicines you are taking, including vitamins, herbs, eye drops, creams, and over-the-counter medicines.  Any problems you or family members have had with anesthetic medicines.  Any blood disorders you have.  Any surgeries you have had.  Any medical conditions you have, including any STIs (sexually transmitted infections) you may have.  Whether you are pregnant or may be pregnant. What are the risks? Generally, this is a safe procedure. However, problems may occur, including:  Infection.  Bleeding.  Allergic reactions to medicines.  Accidental puncture (perforation) of the uterus, or damage to other structures or organs.  Accidental placement of the IUD either in the muscle layer of the uterus (myometrium) or outside the uterus.  The IUD falling out of the uterus (expulsion). This is more common among women who have recently had a child.  Pregnancy that happens in the fallopian tube (ectopic pregnancy).  Infection of  the uterus and fallopian tubes (pelvic inflammatory disease). What happens before the procedure?  Schedule the IUD insertion for when you will have your menstrual period or right after, to make sure you are not pregnant. Placement of the IUD is better tolerated shortly after a menstrual cycle.  Follow instructions from your health care provider about eating or drinking restrictions.  Ask your health care provider about changing or stopping your regular medicines. This is especially important if you are taking diabetes medicines or blood thinners.  You may get a pain reliever to take before the procedure.  You may have tests for: ? Pregnancy. A pregnancy test involves having a urine sample taken. ? STIs. Placing an IUD in someone who has an STI can make the infection worse. ? Cervical cancer. You may have a Pap test to check for this type of cancer. This means collecting cells from your cervix to be examined under a microscope.  You may have a physical exam to determine the size and position of your uterus. The procedure may vary among health care providers and hospitals. What happens during the procedure?  A tool (speculum) will be placed in your vagina and widened so that your health care provider can see your cervix.  Medicine may be applied to your cervix to help lower your risk of infection (antiseptic medicine).  You may be given an anesthetic medicine to numb each side of your cervix (intracervical block or paracervical block). This medicine is usually given by an injection into the cervix.  A tool (uterine sound) will be inserted into your   uterus to determine the length of your uterus and the direction that your uterus may be tilted.  A slim instrument or tube (IUD inserter) that holds the IUD will be inserted into your vagina, through your cervical canal, and into your uterus.  The IUD will be placed in the uterus, and the IUD inserter will be removed.  The strings that are  attached to the IUD will be trimmed so that they lie just below the cervix. The procedure may vary among health care providers and hospitals. What happens after the procedure?  You may have bleeding after the procedure. This is normal. It varies from light bleeding (spotting) for a few days to menstrual-like bleeding.  You may have cramping and pain.  You may feel dizzy or light-headed.  You may have lower back pain. Summary  An intrauterine device (IUD) is a small, T-shaped device that has one or two nylon strings hanging down from it.  Two types of IUDs are available. You may have a copper IUD or a hormone IUD.  Schedule the IUD insertion for when you will have your menstrual period or right after, to make sure you are not pregnant. Placement of the IUD is better tolerated shortly after a menstrual cycle.  You may have bleeding after the procedure. This is normal. It varies from light spotting for a few days to menstrual-like bleeding. This information is not intended to replace advice given to you by your health care provider. Make sure you discuss any questions you have with your health care provider. Document Revised: 12/12/2016 Document Reviewed: 11/21/2015 Elsevier Patient Education  2020 Else[employer] Inc[employer]        Postpartum Care After Vaginal Delivery This sheet gives you information about how to care for yourself from the time you deliver your baby to up to 6-12 weeks after delivery (postpartum period). Your health care provider may also give you more specific instructions. If you have problems or questions, contact your health care provider. Follow these instructions at home: Vaginal bleeding  It is normal to have vaginal bleeding (lochia) after delivery. Wear a sanitary pad for vaginal bleeding and discharge. ? During the first week after delivery, the amount and appearance of lochia is often similar to a menstrual period. ? Over the next few weeks, it will gradually  decrease to a dry, yellow-brown discharge. ? For most women, lochia stops completely by 4-6 weeks after delivery. Vaginal bleeding can vary from woman to woman.  Change your sanitary pads frequently. Watch for any changes in your flow, such as: ? A sudden increase in volume. ? A change in color. ? Large blood clots.  If you pass a blood clot from your vagina, save it and call your health care provider to discuss. Do not flush blood clots down the toilet before talking with your health care provider.  Do not use tampons or douches until your health care provider says this is safe.  If you are not breastfeeding, your period should return 6-8 weeks after delivery. If you are feeding your child breast milk only (exclusive breastfeeding), your period may not return until you stop breastfeeding. Perineal care  Keep the area between the vagina and the anus (perineum) clean and dry as told by your health care provider. Use medicated pads and pain-relieving sprays and creams as directed.  If you had a cut in the perineum (episiotomy) or a tear in the vagina, check the area for signs of infection until you are healed. Check for: ?  More redness, swelling, or pain. ? Fluid or blood coming from the cut or tear. ? Warmth. ? Pus or a bad smell.  You may be given a squirt bottle to use instead of wiping to clean the perineum area after you go to the bathroom. As you start healing, you may use the squirt bottle before wiping yourself. Make sure to wipe gently.  To relieve pain caused by an episiotomy, a tear in the vagina, or swollen veins in the anus (hemorrhoids), try taking a warm sitz bath 2-3 times a day. A sitz bath is a warm water bath that is taken while you are sitting down. The water should only come up to your hips and should cover your buttocks. Breast care  Within the first few days after delivery, your breasts may feel heavy, full, and uncomfortable (breast engorgement). Milk may also leak  from your breasts. Your health care provider can suggest ways to help relieve the discomfort. Breast engorgement should go away within a few days.  If you are breastfeeding: ? Wear a bra that supports your breasts and fits you well. ? Keep your nipples clean and dry. Apply creams and ointments as told by your health care provider. ? You may need to use breast pads to absorb milk that leaks from your breasts. ? You may have uterine contractions every time you breastfeed for up to several weeks after delivery. Uterine contractions help your uterus return to its normal size. ? If you have any problems with breastfeeding, work with your health care provider or Advertising copywriter.  If you are not breastfeeding: ? Avoid touching your breasts a lot. Doing this can make your breasts produce more milk. ? Wear a good-fitting bra and use cold packs to help with swelling. ? Do not squeeze out (express) milk. This causes you to make more milk. Intimacy and sexuality  Ask your health care provider when you can engage in sexual activity. This may depend on: ? Your risk of infection. ? How fast you are healing. ? Your comfort and desire to engage in sexual activity.  You are able to get pregnant after delivery, even if you have not had your period. If desired, talk with your health care provider about methods of birth control (contraception). Medicines  Take over-the-counter and prescription medicines only as told by your health care provider.  If you were prescribed an antibiotic medicine, take it as told by your health care provider. Do not stop taking the antibiotic even if you start to feel better. Activity  Gradually return to your normal activities as told by your health care provider. Ask your health care provider what activities are safe for you.  Rest as much as possible. Try to rest or take a nap while your baby is sleeping. Eating and drinking   Drink enough fluid to keep your urine  pale yellow.  Eat high-fiber foods every day. These may help prevent or relieve constipation. High-fiber foods include: ? Whole grain cereals and breads. ? Brown rice. ? Beans. ? Fresh fruits and vegetables.  Do not try to lose weight quickly by cutting back on calories.  Take your prenatal vitamins until your postpartum checkup or until your health care provider tells you it is okay to stop. Lifestyle  Do not use any products that contain nicotine or tobacco, such as cigarettes and e-cigarettes. If you need help quitting, ask your health care provider.  Do not drink alcohol, especially if you are breastfeeding. General instructions  Keep all follow-up visits for you and your baby as told by your health care provider. Most women visit their health care provider for a postpartum checkup within the first 3-6 weeks after delivery. Contact a health care provider if:  You feel unable to cope with the changes that your child brings to your life, and these feelings do not go away.  You feel unusually sad or worried.  Your breasts become red, painful, or hard.  You have a fever.  You have trouble holding urine or keeping urine from leaking.  You have little or no interest in activities you used to enjoy.  You have not breastfed at all and you have not had a menstrual period for 12 weeks after delivery.  You have stopped breastfeeding and you have not had a menstrual period for 12 weeks after you stopped breastfeeding.  You have questions about caring for yourself or your baby.  You pass a blood clot from your vagina. Get help right away if:  You have chest pain.  You have difficulty breathing.  You have sudden, severe leg pain.  You have severe pain or cramping in your lower abdomen.  You bleed from your vagina so much that you fill more than one sanitary pad in one hour. Bleeding should not be heavier than your heaviest period.  You develop a severe headache.  You  faint.  You have blurred vision or spots in your vision.  You have bad-smelling vaginal discharge.  You have thoughts about hurting yourself or your baby. If you ever feel like you may hurt yourself or others, or have thoughts about taking your own life, get help right away. You can go to the nearest emergency department or call:  Your local emergency services (911 in the [place]  A suicide crisis helpline, such as the National Suicide Prevention Lifeline at (937)472-9704. This is open 24 hours a day. Summary  The period of time right after you deliver your newborn up to 6-12 weeks after delivery is called the postpartum period.  Gradually return to your normal activities as told by your health care provider.  Keep all follow-up visits for you and your baby as told by your health care provider. This information is not intended to replace advice given to you by your health care provider. Make sure you discuss any questions you have with your health care provider. Document Revised: 01/02/2017 Document Reviewed: 10/13/2016 Elsevier Patient Education  2020 [employer].

## 2019-09-22 NOTE — MAU Note (Addendum)
Presents with requesting IUD removal.  Reports IUD inserted post delivery and it's beginning to come out and cause pain.  Reports having scant VB.  Reports has PP f/u appt this  Monday.

## 2019-09-26 ENCOUNTER — Encounter: Payer: Self-pay | Admitting: Family Medicine

## 2019-09-26 ENCOUNTER — Other Ambulatory Visit: Payer: Self-pay

## 2019-09-26 ENCOUNTER — Ambulatory Visit (INDEPENDENT_AMBULATORY_CARE_PROVIDER_SITE_OTHER): Payer: Medicaid Other | Admitting: Family Medicine

## 2019-09-26 DIAGNOSIS — Z30432 Encounter for removal of intrauterine contraceptive device: Secondary | ICD-10-CM | POA: Diagnosis not present

## 2019-09-26 DIAGNOSIS — Z30011 Encounter for initial prescription of contraceptive pills: Secondary | ICD-10-CM

## 2019-09-26 MED ORDER — METRONIDAZOLE 500 MG PO TABS
500.0000 mg | ORAL_TABLET | Freq: Two times a day (BID) | ORAL | 0 refills | Status: DC
Start: 2019-09-26 — End: 2021-12-13

## 2019-09-26 MED ORDER — NORETHIN ACE-ETH ESTRAD-FE 1-20 MG-MCG(24) PO TABS: 1.0000 | ORAL_TABLET | Freq: Every day | ORAL | 11 refills | Status: DC

## 2019-09-26 NOTE — Progress Notes (Signed)
Post Partum Visit Note  Brenda Mayo is a 24 y.o. G56P1011 female who presents for a postpartum visit. She is 4 weeks postpartum following a normal spontaneous vaginal delivery.  I have fully reviewed the prenatal and intrapartum course. The delivery was at 40 gestational weeks.  Anesthesia: epidural. Postpartum course has been normal. She is having continued pain at the area where she had the laceration and is concerned about the IUD's position. She would like to have the IUD removed. Baby is doing well. Baby is feeding by Enfamil. Bleeding staining only. Bowel function is normal. Bladder function is normal. Patient is not sexually active. Contraception method is IUD. Postpartum depression screening: negative.   The pregnancy intention screening data noted above was reviewed. Potential methods of contraception were discussed. The patient elected to proceed with Oral Contraceptive.    Edinburgh Postnatal Depression Scale - 09/26/19 1018      Edinburgh Postnatal Depression Scale:  In the Past 7 Days   I have been able to laugh and see the funny side of things. 0    I have looked forward with enjoyment to things. 0    I have blamed myself unnecessarily when things went wrong. 0    I have been anxious or worried for no good reason. 2    I have felt scared or panicky for no good reason. 1    Things have been getting on top of me. 0    I have been so unhappy that I have had difficulty sleeping. 0    I have felt sad or miserable. 0    I have been so unhappy that I have been crying. 0    The thought of harming myself has occurred to me. 0    Edinburgh Postnatal Depression Scale Total 3            The following portions of the patient's history were reviewed and updated as appropriate: allergies, current medications, past family history, past medical history, past social history, past surgical history and problem list.  Review of Systems Pertinent items are noted in HPI.     Objective:  Blood pressure 113/72, pulse 80, height 5\' 10"  (1.778 m), weight 233 lb (105.7 kg), not currently breastfeeding.  General:  alert, cooperative and no distress  Lungs: clear to auscultation bilaterally  Heart:  regular rate and rhythm, S1, S2 normal, no murmur, click, rub or gallop  Abdomen: soft, non-tender; bowel sounds normal; no masses,  no organomegaly   Vulva:  normal  Vagina: healing laceration - quite tender to touch. mild foul odor.  Cervix:  multiparous appearance   IUD Removal  Patient was in the dorsal lithotomy position, normal external genitalia was noted.  A speculum was placed in the patient's vagina, normal discharge was noted, no lesions. The multiparous cervix was visualized, no lesions, no abnormal discharge.  The strings of the IUD was grasped and pulled using ring forceps.  The IUD was successfully removed in its entirety.  Patient tolerated the procedure well.          Assessment:    Normal postpartum exam. Pap smear not done at today's visit.   Plan:   Essential components of care per ACOG recommendations:  1.  Mood and well being: Patient with negative depression screening today. Reviewed local resources for support.  - Patient does use tobacco.  - hx of drug use? No    2. Infant care and feeding:  -Patient currently breastmilk feeding?  No  -Social determinants of health (SDOH) reviewed in EPIC. No concerns  3. Sexuality, contraception and birth spacing - Patient does not want a pregnancy in the next year.   - Reviewed forms of contraception in tiered fashion. Patient desired oral contraceptives (estrogen/progesterone) today.   - Discussed birth spacing of 18 months  4. Sleep and fatigue -Encouraged family/partner/community support of 4 hrs of uninterrupted sleep to help with mood and fatigue  5. Physical Recovery  - Discussed patients delivery and complications - Patient had a 1st degree laceration, perineal healing reviewed. Patient  expressed understanding - Patient has urinary incontinence? No  - Patient is safe to resume physical activity - will wait for 2 more weeks for sexual activity. - Start flagyl for infection to see if this improves pain. Recheck in 2 weeks.  6.  Health Maintenance - Last pap smear done 04/2019 and was normal with negative HPV.    Levie Heritage, DO [facility] for [employer], [facility]

## 2019-10-06 ENCOUNTER — Ambulatory Visit: Payer: Medicaid Other | Admitting: Family Medicine

## 2019-10-07 ENCOUNTER — Ambulatory Visit: Payer: Medicaid Other | Admitting: Family Medicine

## 2021-05-23 IMAGING — US US OB COMP LESS 14 WK
1 series · 15 of 28 positions shown · non-contrast
Comparison: None.

CLINICAL DATA: 23-year-old pregnant female with pelvic cramping.
LMP: 11/23/2019 corresponding to an estimated gestational age of 12
weeks, 1 day.

EXAM:
OBSTETRIC <14 WK ULTRASOUND
TECHNIQUE: Transabdominal ultrasound was performed for evaluation of the
gestation as well as the maternal uterus and adnexal regions.

[Series 1: us ob comp less 14 wk · 29 acquisitions, 15 frames shown]
[im 1/29]
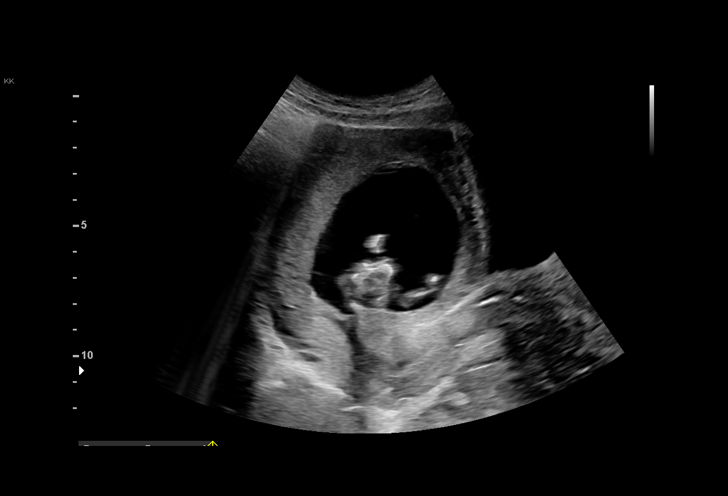
[im 3/29]
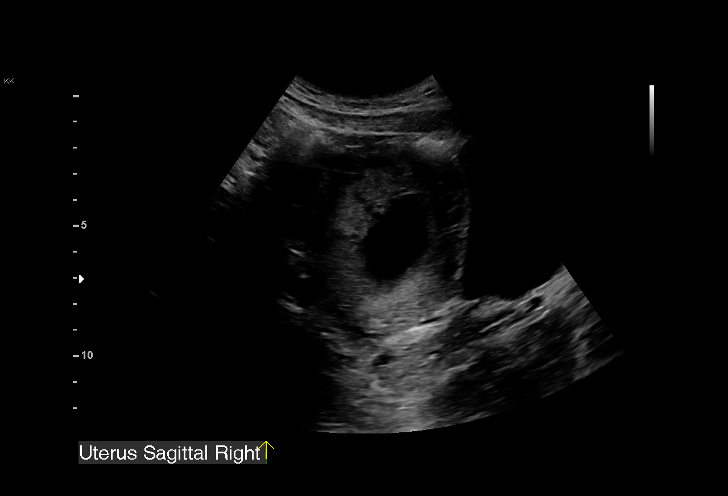
[im 5/29]
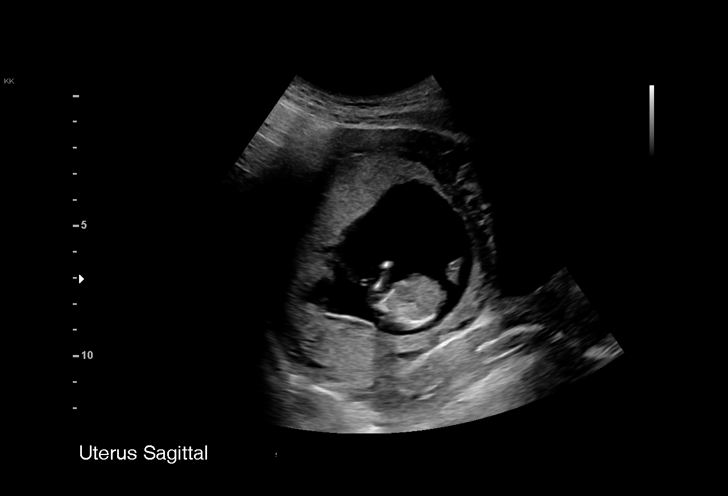
[im 7/29]
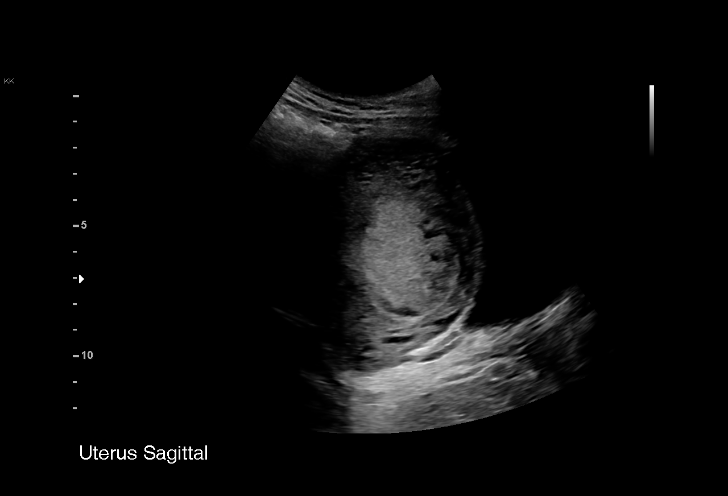
[im 9/29]
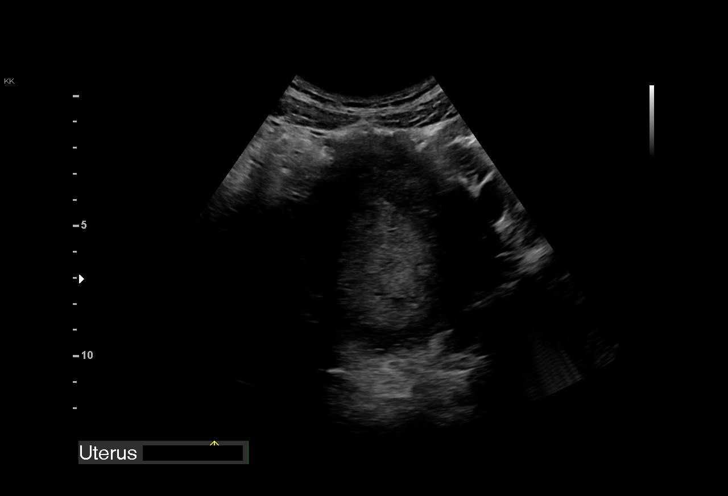
[im 11/29]
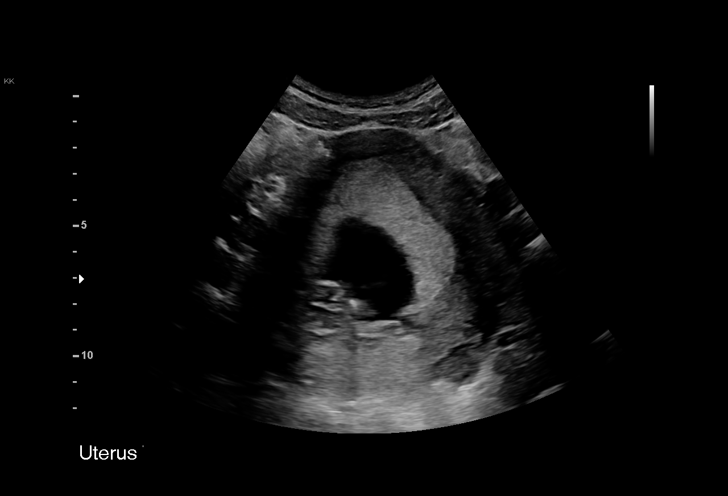
[im 13/29]
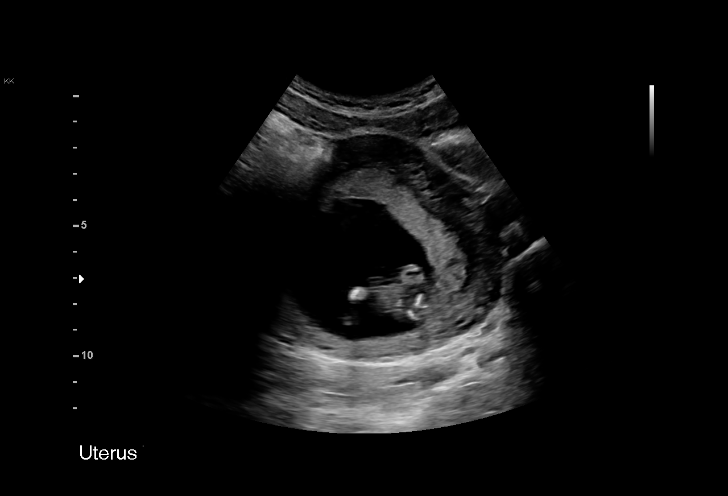
[im 15/29]
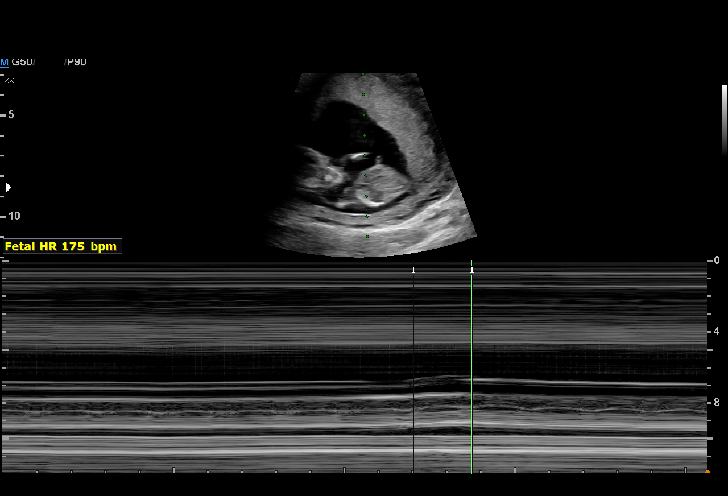
[im 16/29]
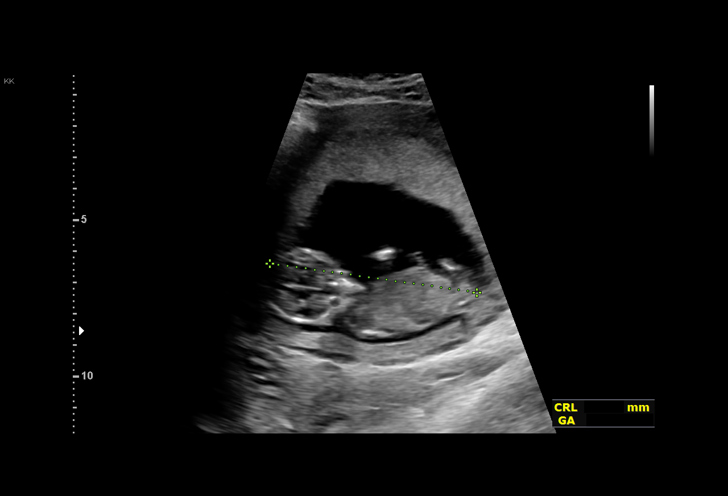
[im 18/29]
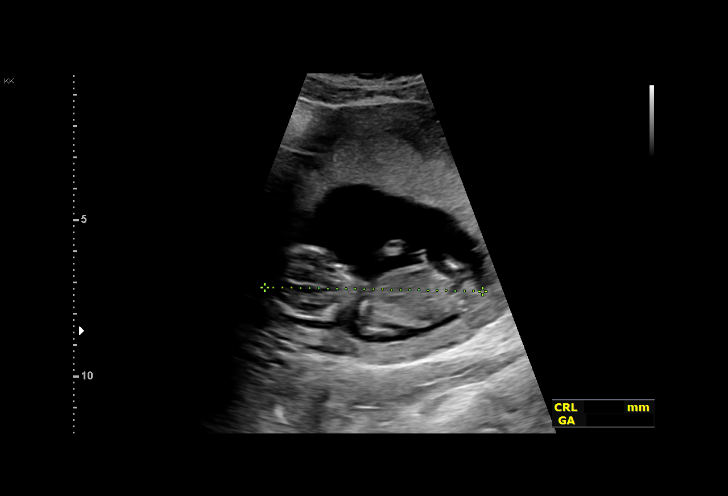
[im 20/29]
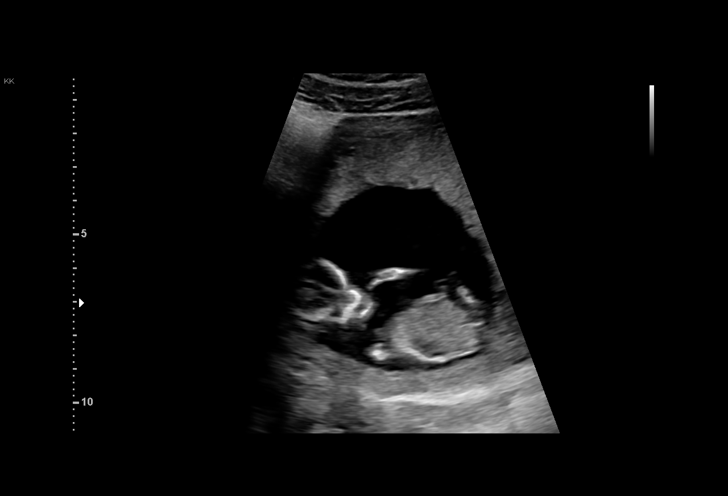
[im 22/29]
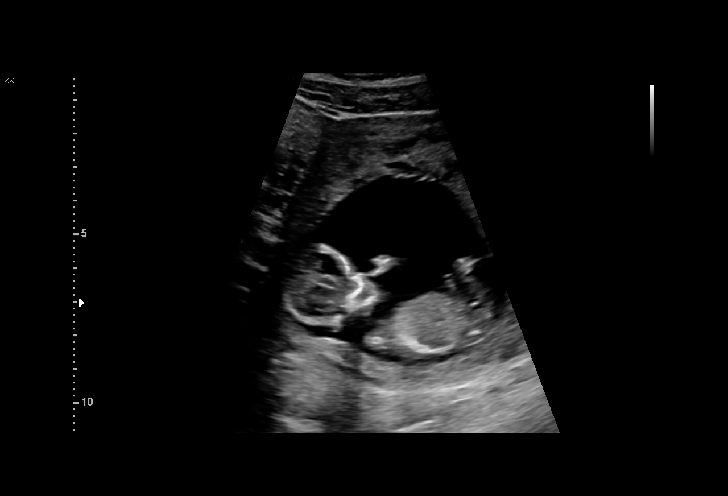
[im 24/29]
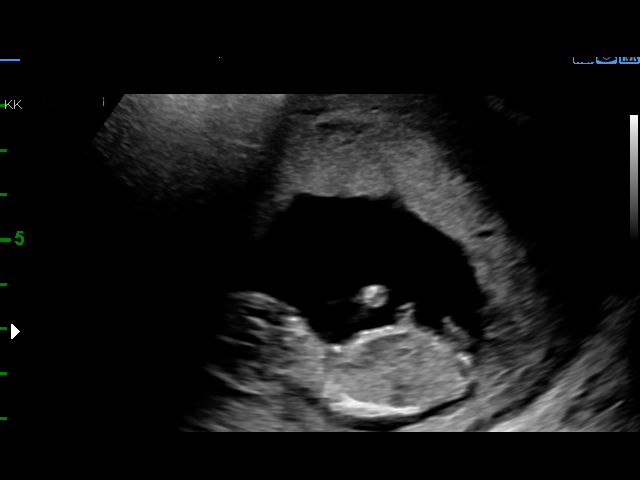
[im 26/29]
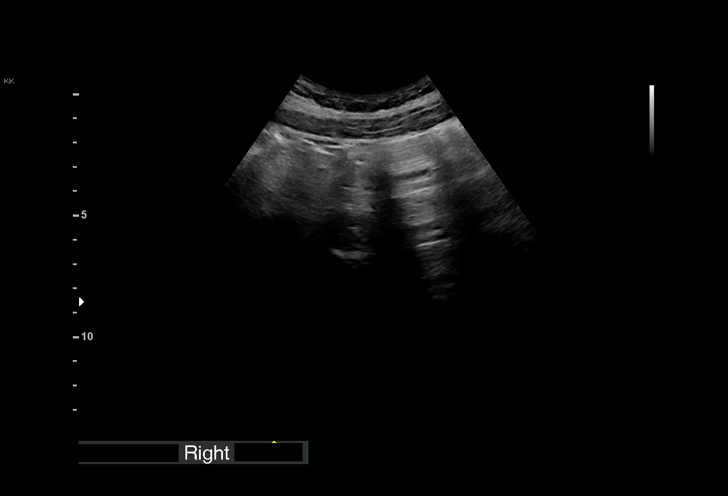
[im 29/29]
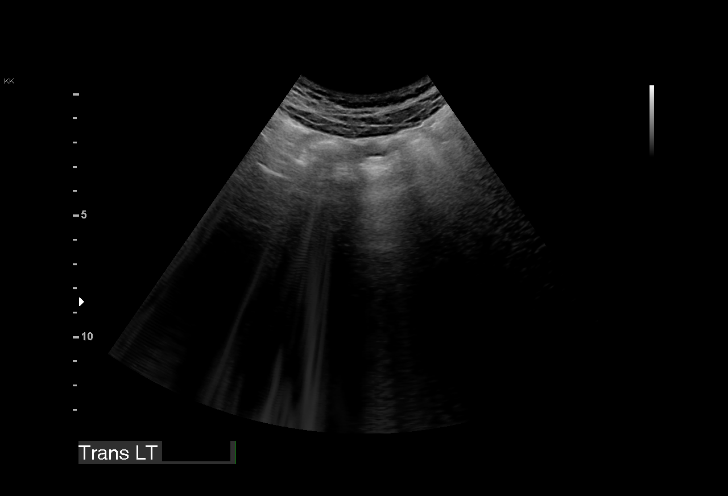

[15 of 28 positions shown; findings below may reference images not displayed]

FINDINGS: Intrauterine gestational sac: Single intrauterine gestational sac.

Yolk sac:  Not seen

Embryo:  Present

Cardiac Activity: Detected

Heart Rate: 175 bpm

CRL: 68 mm   13 w 1 d                  US EDC: 08/23/2019

Subchorionic hemorrhage:  None visualized.

Maternal uterus/adnexae: The maternal ovaries are not visualized and
obscured by bowel gas.
IMPRESSION: Single live intrauterine pregnancy with an estimated gestational age
of 13 weeks, 1 day.

## 2021-08-01 IMAGING — US US MFM OB COMP +14 WKS
1 series · 13 of 28 positions shown · non-contrast
Comparison: none

[Series 1: us mfm ob comp +14 wks · 84 acquisitions, 13 frames shown]
[im 4/84]
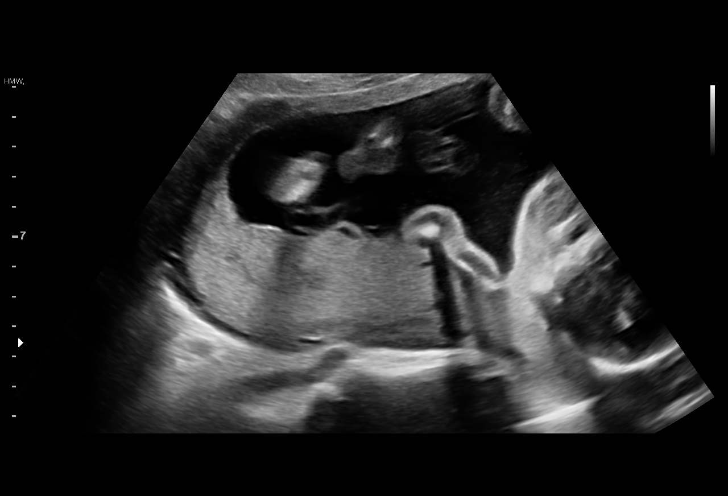
[im 10/84]
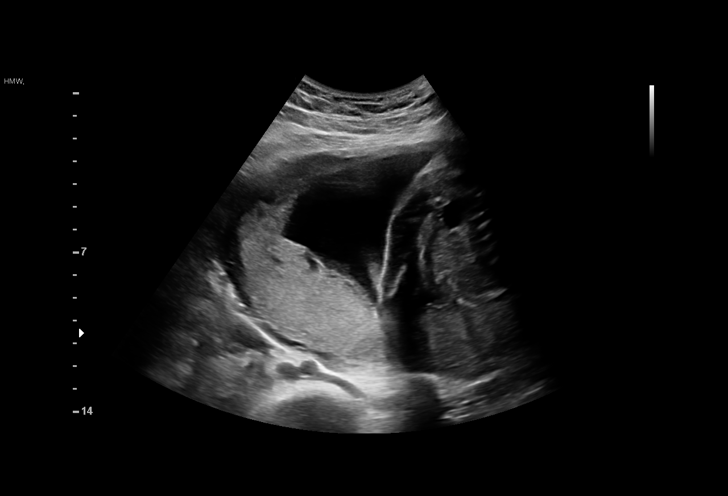
[im 16/84]
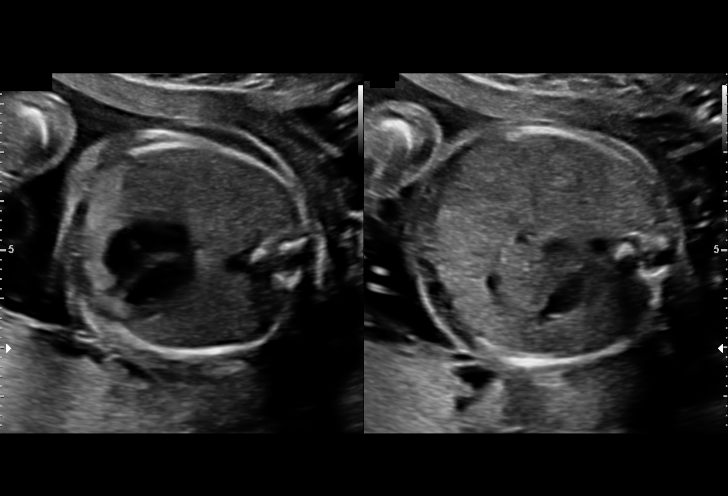
[im 22/84]
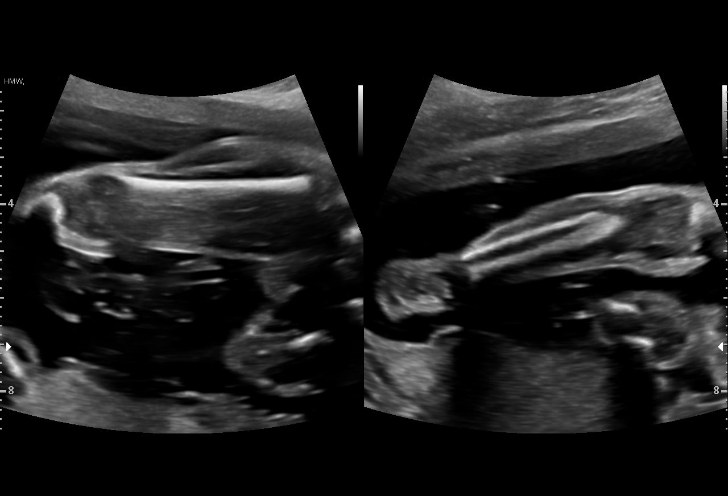
[im 28/84]
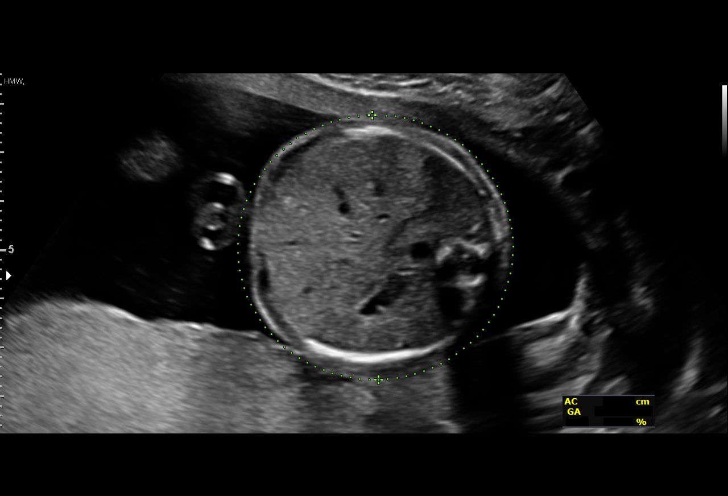
[im 34/84]
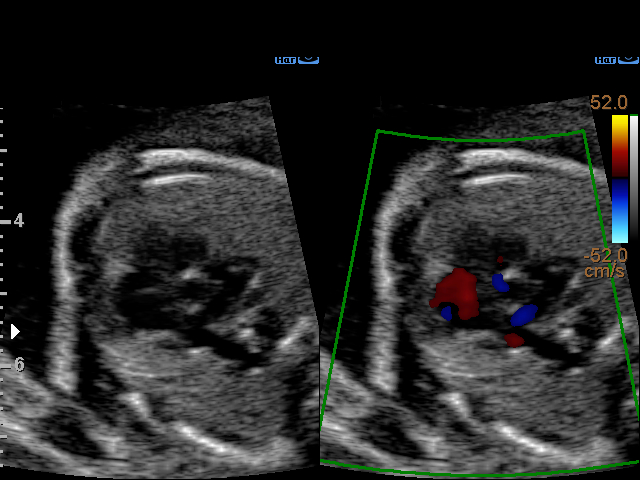
[im 44/84]
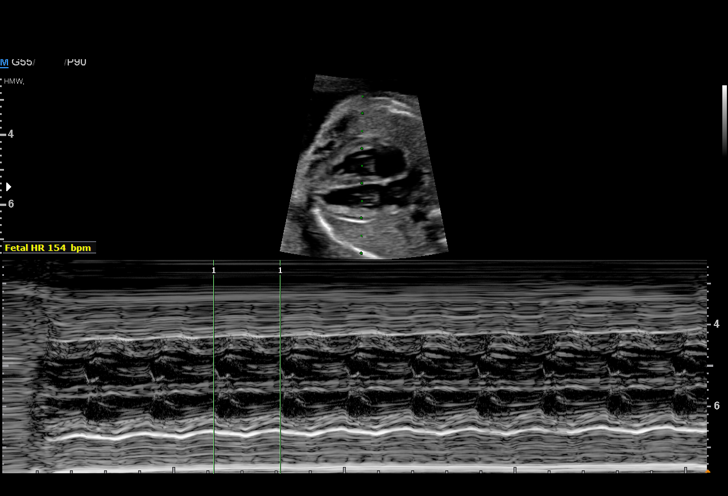
[im 50/84]
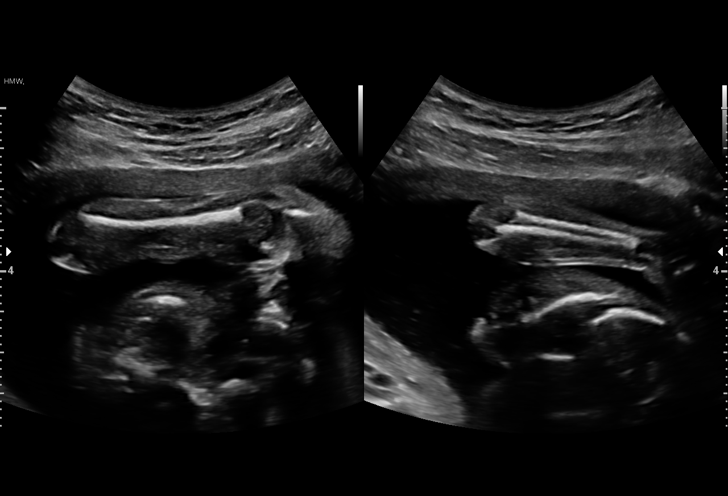
[im 56/84]
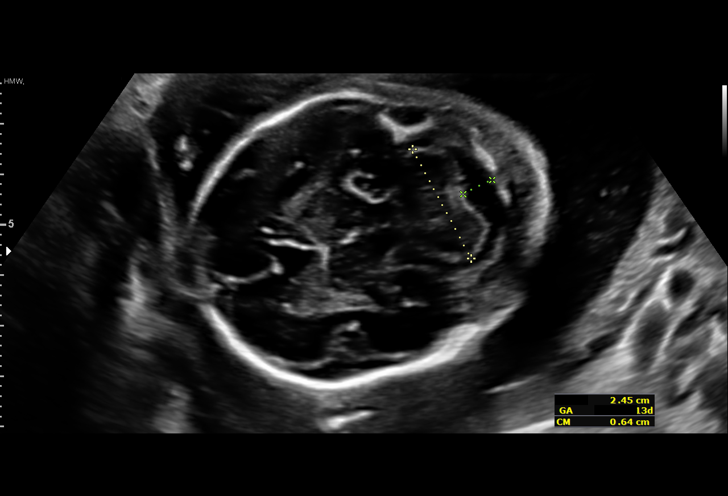
[im 62/84]
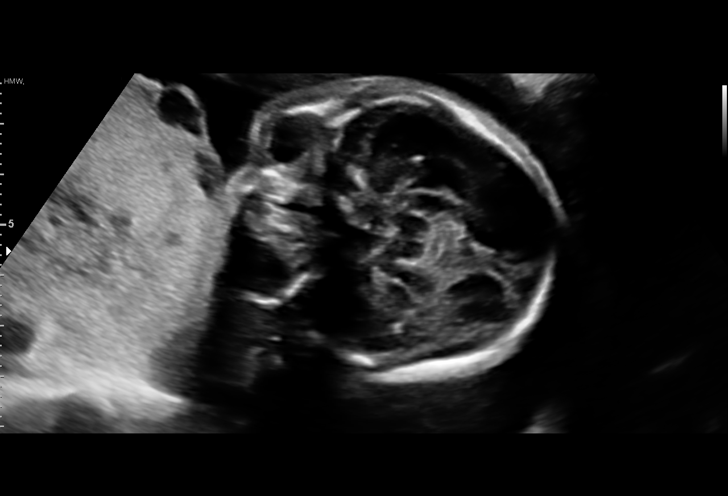
[im 68/84]
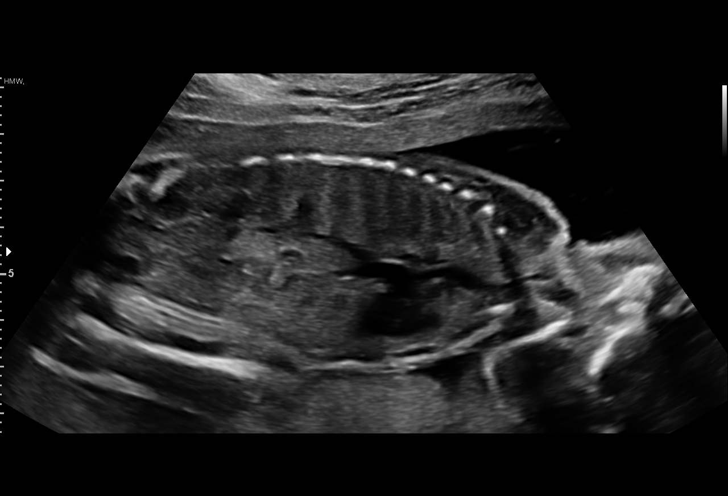
[im 74/84]
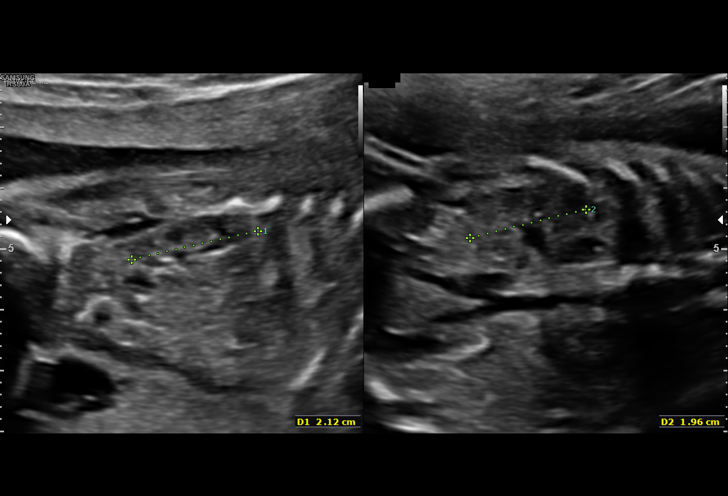
[im 80/84]
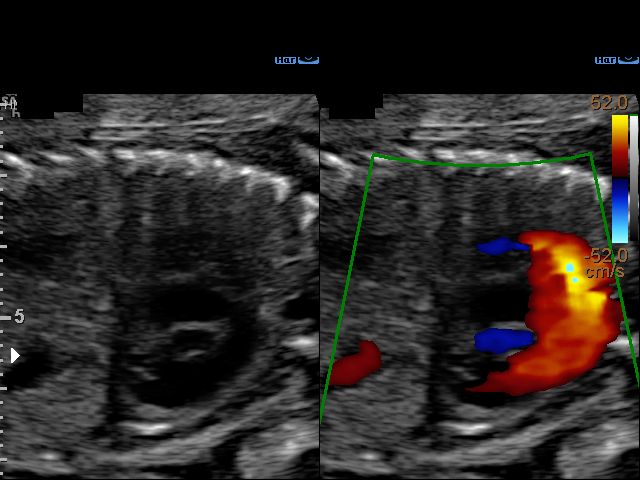

[13 of 28 positions shown; findings below may reference images not displayed]

----------------------------------------------------------------------

 ----------------------------------------------------------------------
Indications

  23 weeks gestation of pregnancy
  Encounter for antenatal screening for
  malformations
  Low risk NIPS,
 ----------------------------------------------------------------------
Fetal Evaluation

 Num Of Fetuses:         1
 Fetal Heart Rate(bpm):  154
 Cardiac Activity:       Observed
 Presentation:           Cephalic
 Placenta:               Posterior
 P. Cord Insertion:      Visualized, central

 Amniotic Fluid
 AFI FV:      Within normal limits

                             Largest Pocket(cm)

Biometry

 BPD:      55.5  mm     G. Age:  22w 6d         37  %    CI:        78.17   %    70 - 86
                                                         FL/HC:      19.1   %    19.2 -
 HC:      198.6  mm     G. Age:  22w 0d          5  %    HC/AC:      1.14        1.05 -
 AC:      174.4  mm     G. Age:  22w 3d         19  %    FL/BPD:     68.3   %    71 - 87
 FL:       37.9  mm     G. Age:  22w 1d         11  %    FL/AC:      21.7   %    20 - 24
 HUM:      37.5  mm     G. Age:  23w 1d         43  %
 CER:      24.5  mm     G. Age:  22w 4d         40  %
 CM:        6.4  mm

 Est. FW:     489  gm      1 lb 1 oz     11  %
OB History

 Gravidity:    2         Term:   0        Prem:   0        SAB:   1
 TOP:          0       Ectopic:  0        Living: 0
Gestational Age

 LMP:           22w 1d        Date:  11/23/18                 EDD:   08/30/19
 U/S Today:     22w 3d                                        EDD:   08/28/19
 Best:          23w 1d     Det. By:  Early Ultrasound         EDD:   08/23/19
                                     (02/16/19)
Anatomy

 Cranium:               Appears normal         LVOT:                   Appears normal
 Cavum:                 Appears normal         Aortic Arch:            Appears normal
 Ventricles:            Appears normal         Ductal Arch:            Appears normal
 Choroid Plexus:        Appears normal         Diaphragm:              Appears normal
 Cerebellum:            Appears normal         Stomach:                Appears normal, left
                                                                       sided
 Posterior Fossa:       Appears normal         Abdomen:                Appears normal
 Nuchal Fold:           Appears normal         Abdominal Wall:         Appears nml (cord
                                                                       insert, abd wall)
 Face:                  Orbits nl; profile not Cord Vessels:           Appears normal (3
                        well visualized                                vessel cord)
 Lips:                  Appears normal         Kidneys:                Appear normal
 Palate:                Appears normal         Bladder:                Appears normal
 Thoracic:              Appears normal         Spine:                  Appears normal
 Heart:                 Appears normal         Upper Extremities:      Appears normal
                        (4CH, axis, and
                        situs)
 RVOT:                  Appears normal         Lower Extremities:      Appears normal

 Other:  Fetus appears to be a male. Heels visualized. Hands not well
         visualized. Technically difficult due to fetal position.
Cervix Uterus Adnexa

 Cervix
 Length:            3.2  cm.
 Normal appearance by transabdominal scan.

 Uterus
 No abnormality visualized.

 Left Ovary
 No adnexal mass visualized.

 Right Ovary
 No adnexal mass visualized.

 Cul De Sac
 No free fluid seen.

 Adnexa
 No abnormality visualized.
Impression

 G2 WOOVO. Patient is here for fetal anatomy scan.

 On cell-free fetal DNA screening, the risks of fetal
 aneuploidies are not increased.
 We performed fetal anatomy scan. No makers of
 aneuploidies or fetal structural defects are seen. Fetal
 biometry is consistent with her previously-established dates.
 Amniotic fluid is normal and good fetal activity is seen.
 Patient understands the limitations of ultrasound in detecting
 fetal anomalies.
Recommendations

 -An appointment was made for her to return in 4 weeks for
 completion of fetal anatomy (profile and revisit cardiac
 anatomy).
                 Rifai, Jomanah

## 2021-11-22 ENCOUNTER — Telehealth: Payer: Self-pay | Admitting: Emergency Medicine

## 2021-11-22 ENCOUNTER — Ambulatory Visit
Admission: EM | Admit: 2021-11-22 | Discharge: 2021-11-22 | Disposition: A | Discharge status: Home / Self Care | Payer: Medicaid Other | Attending: Urgent Care | Admitting: Urgent Care

## 2021-11-22 ENCOUNTER — Encounter: Payer: Self-pay | Admitting: Emergency Medicine

## 2021-11-22 DIAGNOSIS — Z3A09 9 weeks gestation of pregnancy: Secondary | ICD-10-CM

## 2021-11-22 DIAGNOSIS — R058 Other specified cough: Secondary | ICD-10-CM

## 2021-11-22 DIAGNOSIS — U071 COVID-19: Secondary | ICD-10-CM | POA: Diagnosis not present

## 2021-11-22 DIAGNOSIS — B349 Viral infection, unspecified: Secondary | ICD-10-CM | POA: Diagnosis present

## 2021-11-22 DIAGNOSIS — R070 Pain in throat: Secondary | ICD-10-CM

## 2021-11-22 MED ORDER — ACETAMINOPHEN 325 MG PO TABS: 650.0000 mg | ORAL_TABLET | Freq: Four times a day (QID) | ORAL | 0 refills | Status: DC | PRN

## 2021-11-22 MED ORDER — CETIRIZINE HCL 10 MG PO TABS: 10.0000 mg | ORAL_TABLET | Freq: Every day | ORAL | 0 refills | Status: DC

## 2021-11-22 NOTE — Discharge Instructions (Addendum)
We will notify you of your test results as they arrive and may take between about 24 hours.  I encourage you to sign up for MyChart if you have not already done so as this can be the easiest way for [place] to communicate results to you online or through a phone app.  Generally, we only contact you if it is a positive test result.  In the meantime, if you develop worsening symptoms including fever, chest pain, shortness of breath despite our current treatment plan then please report to the emergency room as this may be a sign of worsening status from possible viral infection.  Otherwise, we will manage this as a viral syndrome. For sore throat or cough try using a honey-based tea. Use 3 teaspoons of honey with juice squeezed from half lemon. Place shaved pieces of ginger into 1/2-1 cup of water and warm over stove top. Then mix the ingredients and repeat every 4 hours as needed. Please take Tylenol 500mg -650mg  every 6 hours for aches and pains, fevers. Hydrate very well with at least 2 liters of water. Eat light meals such as soups to replenish electrolytes and soft fruits, veggies. Start an antihistamine like Zyrtec for postnasal drainage, sinus congestion.  You can take this together with Zinc 20mg  twice daily. Use non-alcohol based cough drops as needed.

## 2021-11-22 NOTE — ED Provider Notes (Signed)
[facility]  Note:  This document was prepared using Conservation officer, historic buildings and may include unintentional dictation errors.  MRN: [id number] DOB: 05-02-1995  Subjective:   Brenda Mayo is a 26 y.o. female [redacted] weeks pregnant presenting for 2-day history of acute onset body aches, dry cough, throat pain, malaise and fatigue.  Patient has her son here today who is also been sick as well as her husband.  No testing has been done for any of them and only the son is being seen together with her mother.  No overt chest pain, shortness of breath or wheezing.  No history of respiratory disorders.  Patient is not smoking.  She is taking prenatal vitamin only.  Allergies  Allergen Reactions   Penicillins     Reports as child, does not know the reaction type.    Tomato Hives    Past Medical History:  Diagnosis Date   Anemia    Sickle cell trait (HCC)      Past Surgical History:  Procedure Laterality Date   NO PAST SURGERIES      Family History  Problem Relation Age of Onset   Sickle cell trait Mother     Social History   Tobacco Use   Smoking status: Never   Smokeless tobacco: Never  Vaping Use   Vaping Use: Never used  Substance Use Topics   Alcohol use: Never   Drug use: Never    ROS   Objective:   Vitals: BP 125/84 (BP Location: Left Arm)   Pulse 100   Temp 99.2 F (37.3 C) (Oral)   Resp 16   SpO2 99%   Physical Exam Constitutional:      General: She is not in acute distress.    Appearance: Normal appearance. She is well-developed and normal weight. She is not ill-appearing, toxic-appearing or diaphoretic.  HENT:     Head: Normocephalic and atraumatic.     Right Ear: Tympanic membrane, ear canal and external ear normal. No drainage or tenderness. No middle ear effusion. There is no impacted cerumen. Tympanic membrane is not erythematous or bulging.     Left Ear: Tympanic membrane, ear canal and external ear  normal. No drainage or tenderness.  No middle ear effusion. There is no impacted cerumen. Tympanic membrane is not erythematous or bulging.     Nose: Nose normal. No congestion or rhinorrhea.     Mouth/Throat:     Mouth: Mucous membranes are moist. No oral lesions.     Pharynx: No pharyngeal swelling, oropharyngeal exudate, posterior oropharyngeal erythema or uvula swelling.     Tonsils: No tonsillar exudate or tonsillar abscesses.  Eyes:     General: No scleral icterus.       Right eye: No discharge.        Left eye: No discharge.     Extraocular Movements: Extraocular movements intact.     Right eye: Normal extraocular motion.     Left eye: Normal extraocular motion.     Conjunctiva/sclera: Conjunctivae normal.  Cardiovascular:     Rate and Rhythm: Normal rate and regular rhythm.     Heart sounds: Normal heart sounds. No murmur heard.    No friction rub. No gallop.  Pulmonary:     Effort: Pulmonary effort is normal. No respiratory distress.     Breath sounds: No stridor. No wheezing, rhonchi or rales.  Chest:     Chest wall: No tenderness.  Musculoskeletal:     Cervical  back: Normal range of motion and neck supple.  Lymphadenopathy:     Cervical: No cervical adenopathy.  Skin:    General: Skin is warm and dry.  Neurological:     General: No focal deficit present.     Mental Status: She is alert and oriented to person, place, and time.  Psychiatric:        Mood and Affect: Mood normal.        Behavior: Behavior normal.     Assessment and Plan :   PDMP not reviewed this encounter.  1. Acute viral syndrome   2. Dry cough   3. Throat pain   4. [redacted] weeks gestation of pregnancy     The patient test positive for COVID-19 it would be excellent to start her on Paxlovid given her pregnancy. Will manage for viral illness such as viral URI, viral syndrome, viral rhinitis, COVID-19. Recommended supportive care. Offered scripts for symptomatic relief. Testing is pending. Counseled  patient on potential for adverse effects with medications prescribed/recommended today, ER and return-to-clinic precautions discussed, patient verbalized understanding.     Wallis Bamberg, PA-C 11/22/21 1911

## 2021-11-22 NOTE — ED Triage Notes (Addendum)
Sore throat, dry cough, body aches x 2 days. Family is sick. Temp tmax 101 at home

## 2021-11-23 LAB — RESP PANEL BY RT-PCR (FLU A&B, COVID) ARPGX2
Influenza A by PCR: NEGATIVE
Influenza B by PCR: NEGATIVE
SARS Coronavirus 2 by RT PCR: POSITIVE — AB

## 2021-11-25 ENCOUNTER — Telehealth (HOSPITAL_COMMUNITY): Payer: Self-pay | Admitting: Emergency Medicine

## 2021-11-25 MED ORDER — NIRMATRELVIR/RITONAVIR (PAXLOVID)TABLET: 3.0000 | ORAL_TABLET | Freq: Two times a day (BID) | ORAL | 0 refills | Status: AC

## 2021-12-13 ENCOUNTER — Inpatient Hospital Stay (HOSPITAL_COMMUNITY)
Admission: AD | Admit: 2021-12-13 | Discharge: 2021-12-13 | Disposition: A | Discharge status: Home / Self Care | Payer: Medicaid Other | Attending: Obstetrics and Gynecology | Admitting: Obstetrics and Gynecology

## 2021-12-13 ENCOUNTER — Encounter (HOSPITAL_COMMUNITY): Payer: Self-pay | Admitting: Obstetrics and Gynecology

## 2021-12-13 ENCOUNTER — Ambulatory Visit
Admission: EM | Admit: 2021-12-13 | Discharge: 2021-12-13 | Disposition: A | Discharge status: Home / Self Care | Payer: Medicaid Other

## 2021-12-13 DIAGNOSIS — Z3A12 12 weeks gestation of pregnancy: Secondary | ICD-10-CM

## 2021-12-13 DIAGNOSIS — Z3A01 Less than 8 weeks gestation of pregnancy: Secondary | ICD-10-CM | POA: Insufficient documentation

## 2021-12-13 DIAGNOSIS — R1031 Right lower quadrant pain: Secondary | ICD-10-CM | POA: Diagnosis present

## 2021-12-13 DIAGNOSIS — O26891 Other specified pregnancy related conditions, first trimester: Secondary | ICD-10-CM | POA: Diagnosis present

## 2021-12-13 DIAGNOSIS — O23591 Infection of other part of genital tract in pregnancy, first trimester: Secondary | ICD-10-CM | POA: Insufficient documentation

## 2021-12-13 DIAGNOSIS — M549 Dorsalgia, unspecified: Secondary | ICD-10-CM | POA: Diagnosis present

## 2021-12-13 DIAGNOSIS — B9689 Other specified bacterial agents as the cause of diseases classified elsewhere: Secondary | ICD-10-CM | POA: Diagnosis not present

## 2021-12-13 LAB — URINALYSIS, ROUTINE W REFLEX MICROSCOPIC
Bacteria, UA: NONE SEEN
Bilirubin Urine: NEGATIVE
Glucose, UA: NEGATIVE mg/dL
Hgb urine dipstick: NEGATIVE
Ketones, ur: NEGATIVE mg/dL
Nitrite: NEGATIVE
Protein, ur: NEGATIVE mg/dL
Specific Gravity, Urine: 1.016 (ref 1.005–1.030)
pH: 5 (ref 5.0–8.0)

## 2021-12-13 LAB — WET PREP, GENITAL
Sperm: NONE SEEN
Trich, Wet Prep: NONE SEEN
WBC, Wet Prep HPF POC: <10 (ref <10)
Yeast Wet Prep HPF POC: NONE SEEN

## 2021-12-13 LAB — POCT PREGNANCY, URINE: Preg Test, Ur: POSITIVE — AB

## 2021-12-13 MED ORDER — METRONIDAZOLE 500 MG PO TABS: 500.0000 mg | ORAL_TABLET | Freq: Two times a day (BID) | ORAL | 0 refills | Status: DC

## 2021-12-13 NOTE — ED Triage Notes (Signed)
Pt reports right lower quadrants abdominal pain an right sided lower back pain started around 1400 today. Reports 12 weeks pregnancy. Denies nay vaginal bleeding.

## 2021-12-13 NOTE — MAU Note (Signed)
Pt says she made appointment for [facility] office 12-26-2021 Told them she was preg- -positive HPT  Tonight has pain in RLQ and right lower back - started at 2 pm  No meds for pain

## 2021-12-13 NOTE — MAU Provider Note (Signed)
History     CSN: [id number]  Arrival date and time: 12/13/21 5427   Event Date/Time   First Provider Initiated Contact with Patient 12/13/21 2102      Chief Complaint  Patient presents with   Abdominal Pain   Brenda Mayo is a 26 y.o. G3P1011 at [redacted]w[redacted]d by Unsure LMP of Sept 5, 2023 who has not established care.  She reports she has an appt on Dec 14th at [facility].  She presents today for Abdominal Pain.  She states around 1400 she started having constant right side abdominal pain.  She states the pain was "like a cramp down in my uterus" and was constant. She also reports she was having back pain and states the pain was a 10/10.  She reports the pain improved and is now 7/10.    OB History     Gravida  3   Para  1   Term  1   Preterm      AB  1   Living  1      SAB  1   IAB      Ectopic      Multiple  0   Live Births  1           Past Medical History:  Diagnosis Date   Anemia    Sickle cell trait (HCC)     Past Surgical History:  Procedure Laterality Date   NO PAST SURGERIES      Family History  Problem Relation Age of Onset   Sickle cell trait Mother     Social History   Tobacco Use   Smoking status: Never   Smokeless tobacco: Never  Vaping Use   Vaping Use: Never used  Substance Use Topics   Alcohol use: Never   Drug use: Never    Allergies:  Allergies  Allergen Reactions   Penicillins     Reports as child, does not know the reaction type.    Tomato Hives    Medications Prior to Admission  Medication Sig Dispense Refill Last Dose   acetaminophen (TYLENOL) 325 MG tablet Take 2 tablets (650 mg total) by mouth every 6 (six) hours as needed for moderate pain. 30 tablet 0    cetirizine (ZYRTEC ALLERGY) 10 MG tablet Take 1 tablet (10 mg total) by mouth daily. 30 tablet 0    ibuprofen (ADVIL) 600 MG tablet Take 1 tablet (600 mg total) by mouth every 6 (six) hours as needed. (Patient not taking: Reported  on 09/26/2019) 30 tablet 0    metroNIDAZOLE (FLAGYL) 500 MG tablet Take 1 tablet (500 mg total) by mouth 2 (two) times daily. 14 tablet 0    Norethindrone Acetate-Ethinyl Estrad-FE (LOESTRIN 24 FE) 1-20 MG-MCG(24) tablet Take 1 tablet by mouth daily. 28 tablet 11    polyethylene glycol powder (GLYCOLAX/MIRALAX) 17 GM/SCOOP powder Take 17 g by mouth daily. (Patient not taking: Reported on 09/26/2019) 500 g 0    prenatal vitamin w/FE, FA (PRENATAL 1 + 1) 27-1 MG TABS tablet Take 1 tablet by mouth daily at 12 noon.      simethicone (MYLICON) 80 MG chewable tablet Chew 1 tablet (80 mg total) by mouth as needed for flatulence. (Patient not taking: Reported on 09/26/2019) 15 tablet 0     Review of Systems  Gastrointestinal:  Positive for abdominal pain. Negative for constipation (Recently resolved), diarrhea, nausea and vomiting.  Genitourinary:  Negative for difficulty urinating, dysuria, vaginal bleeding and vaginal  discharge.  Neurological:  Positive for headaches (Daily all week. Temples; pinch 7/10). Negative for dizziness and light-headedness.   Physical Exam   Blood pressure 119/68, pulse 88, temperature 98.5 F (36.9 C), temperature source Oral, resp. rate 18, height 5\' 10"  (1.778 m), weight 92.5 kg, last menstrual period 08/20/2021.  Physical Exam Vitals reviewed.  Constitutional:      Appearance: Normal appearance. She is well-developed.  HENT:     Head: Normocephalic and atraumatic.  Eyes:     Conjunctiva/sclera: Conjunctivae normal.  Cardiovascular:     Rate and Rhythm: Normal rate and regular rhythm.  Pulmonary:     Effort: Pulmonary effort is normal. No respiratory distress.     Breath sounds: Normal breath sounds.  Abdominal:     General: Bowel sounds are normal.     Palpations: Abdomen is soft.     Tenderness: There is abdominal tenderness in the right lower quadrant. There is no guarding or rebound.  Musculoskeletal:        General: Normal range of motion.     Cervical  back: Normal range of motion.  Skin:    General: Skin is warm and dry.  Neurological:     Mental Status: She is alert and oriented to person, place, and time.  Psychiatric:        Mood and Affect: Mood normal.        Behavior: Behavior normal.     MAU Course  Procedures Results for orders placed or performed during the hospital encounter of 12/13/21 (from the past 24 hour(s))  Pregnancy, urine POC     Status: Abnormal   Collection Time: 12/13/21  8:49 PM  Result Value Ref Range   Preg Test, Ur POSITIVE (A) NEGATIVE  Wet prep, genital     Status: Abnormal   Collection Time: 12/13/21  9:27 PM  Result Value Ref Range   Yeast Wet Prep HPF POC NONE SEEN NONE SEEN   Trich, Wet Prep NONE SEEN NONE SEEN   Clue Cells Wet Prep HPF POC PRESENT (A) NONE SEEN   WBC, Wet Prep HPF POC <10 <10   Sperm NONE SEEN   Urinalysis, Routine w reflex microscopic     Status: Abnormal   Collection Time: 12/13/21  9:36 PM  Result Value Ref Range   Color, Urine YELLOW YELLOW   APPearance CLEAR CLEAR   Specific Gravity, Urine 1.016 1.005 - 1.030   pH 5.0 5.0 - 8.0   Glucose, UA NEGATIVE NEGATIVE mg/dL   Hgb urine dipstick NEGATIVE NEGATIVE   Bilirubin Urine NEGATIVE NEGATIVE   Ketones, ur NEGATIVE NEGATIVE mg/dL   Protein, ur NEGATIVE NEGATIVE mg/dL   Nitrite NEGATIVE NEGATIVE   Leukocytes,Ua SMALL (A) NEGATIVE   RBC / HPF 0-5 0 - 5 RBC/hpf   WBC, UA 0-5 0 - 5 WBC/hpf   Bacteria, UA NONE SEEN NONE SEEN   Squamous Epithelial / LPF 0-5 0 - 5   Patient informed that the ultrasound is considered a limited OB ultrasound and is not intended to be a complete ultrasound exam.  Patient also informed that the ultrasound is not being completed with the intent of assessing for fetal or placental anomalies or any pelvic abnormalities.  Explained that the purpose of today's ultrasound is to assess for   Dating and viability.  Patient acknowledges the purpose of the exam and the limitations of the  study.         MDM Exam Labs: UA Wet prep & GC/CT BSUS  Prescription Assessment and Plan  26 year old G3P1011 at 16.3 weeks Right Side Abdominal Pain  -Reviewed POC with patient. -Exam performed.  -Patient offered and declines pain medication.  -Cultures collected via self-swab. -UA Pending -Will await results and perform BSUS confirm IUP.    Cherre Robins 12/13/2021, 9:06 PM   Reassessment (10:19 PM) Bacterial Vaginosis SIUP at 12.6 weeks  -BSUS performed and informed that size c/w dates.  Will leave EDD at 06/24/2022. -Reviewed labs and informed of findings c/w BV. -Rx for metronidazole renewed from Sept. -Patient questions if overexertion can cause pain.  Informed that this could be possible.  Encouraged rest when possible.  -Instructed to follow up as scheduled. -Encouraged to call primary office or return to MAU if symptoms worsen or with the onset of new symptoms. -Discharged to home in stable condition.  Cherre Robins MSN, CNM Advanced Practice Provider, [facility] for [employer]

## 2021-12-16 LAB — GC/CHLAMYDIA PROBE AMP (~~LOC~~) NOT AT ARMC
Chlamydia: NEGATIVE
Comment: NEGATIVE
Comment: NORMAL
Neisseria Gonorrhea: NEGATIVE

## 2021-12-26 ENCOUNTER — Ambulatory Visit (INDEPENDENT_AMBULATORY_CARE_PROVIDER_SITE_OTHER): Payer: Medicaid Other | Admitting: Family Medicine

## 2021-12-26 ENCOUNTER — Encounter: Payer: Self-pay | Admitting: General Practice

## 2021-12-26 ENCOUNTER — Other Ambulatory Visit (HOSPITAL_COMMUNITY)
Admission: RE | Admit: 2021-12-26 | Discharge: 2021-12-26 | Disposition: A | Discharge status: Home / Self Care | Payer: Medicaid Other | Source: Ambulatory Visit | Attending: Family Medicine | Admitting: Family Medicine

## 2021-12-26 VITALS — BP 119/67 | HR 96 | Wt 207.0 lb

## 2021-12-26 DIAGNOSIS — Z1332 Encounter for screening for maternal depression: Secondary | ICD-10-CM

## 2021-12-26 DIAGNOSIS — Z3A14 14 weeks gestation of pregnancy: Secondary | ICD-10-CM

## 2021-12-26 DIAGNOSIS — Z3480 Encounter for supervision of other normal pregnancy, unspecified trimester: Secondary | ICD-10-CM | POA: Diagnosis present

## 2021-12-26 DIAGNOSIS — O4402 Placenta previa specified as without hemorrhage, second trimester: Secondary | ICD-10-CM | POA: Diagnosis not present

## 2021-12-26 DIAGNOSIS — Z3482 Encounter for supervision of other normal pregnancy, second trimester: Secondary | ICD-10-CM | POA: Diagnosis not present

## 2021-12-26 NOTE — Progress Notes (Signed)
Subjective:  Brenda Mayo is a G3P1011 [redacted]w[redacted]d by LMP c/w [place] today being seen today for her first obstetrical visit.  Her obstetrical history is significant for  normal vaginal delivery her first pregnancy . No medical or obstetrical complications. Patient does intend to breast feed. Pregnancy history fully reviewed.  Patient reports no complaints.  BP 119/67   Pulse 96   Wt 207 lb (93.9 kg)   LMP 09/17/2021 (Within Days)   BMI 29.70 kg/m   HISTORY: OB History  Gravida Para Term Preterm AB Living  3 1 1   1 1   SAB IAB Ectopic Multiple Live Births  1     0 1    # Outcome Date GA Lbr Len/2nd Weight Sex Delivery Anes PTL Lv  3 Current           2 Term 08/24/19 [redacted]w[redacted]d 17:00 / 01:01 8 lb 3.2 oz (3.72 kg) M Vag-Spont EPI  LIV  1 SAB 2017            Past Medical History:  Diagnosis Date   Anemia    Sickle cell trait (HCC)     Past Surgical History:  Procedure Laterality Date   NO PAST SURGERIES      Family History  Problem Relation Age of Onset   Sickle cell trait Mother      Exam  BP 119/67   Pulse 96   Wt 207 lb (93.9 kg)   LMP 09/17/2021 (Within Days)   BMI 29.70 kg/m   Chaperone present during exam  CONSTITUTIONAL: Well-developed, well-nourished female in no acute distress.  HENT:  Normocephalic, atraumatic, External right and left ear normal. Oropharynx is clear and moist EYES: Conjunctivae and EOM are normal. Pupils are equal, round, and reactive to light. No scleral icterus.  NECK: Normal range of motion, supple, no masses.  Normal thyroid.  CARDIOVASCULAR: Normal heart rate noted, regular rhythm RESPIRATORY: Clear to auscultation bilaterally. Effort and breath sounds normal, no problems with respiration noted. BREASTS: Symmetric in size. No masses, skin changes, nipple drainage, or lymphadenopathy. ABDOMEN: Soft, normal bowel sounds, no distention noted.  No tenderness, rebound or guarding.  PELVIC: deferred MUSCULOSKELETAL: Normal range of  motion. No tenderness.  No cyanosis, clubbing, or edema.  2+ distal pulses. SKIN: Skin is warm and dry. No rash noted. Not diaphoretic. No erythema. No pallor. NEUROLOGIC: Alert and oriented to person, place, and time. Normal reflexes, muscle tone coordination. No cranial nerve deficit noted. PSYCHIATRIC: Normal mood and affect. Normal behavior. Normal judgment and thought content.    Assessment:    Pregnancy: G3P1011 Patient Active Problem List   Diagnosis Date Noted   Supervision of other normal pregnancy, antepartum 12/26/2021   Placenta previa in second trimester 12/26/2021      Plan:   1. [redacted] weeks gestation of pregnancy  2. Supervision of other normal pregnancy, antepartum FHT normal.  - CBC/D/Plt+RPR+Rh+ABO+RubIgG... - Culture, OB Urine - Panorama Prenatal Test Full Panel - [facility] AMB BABYSCRIPTS OPT IN - 12/28/2021 MFM OB COMP + 14 WK; Future - Cervicovaginal ancillary only( [place])  3. Placenta previa in second trimester US done by me - appears to have a placenta previa. Recommended pelvic rest. F/u US in 4-6 weeks with MFM.    Initial labs obtained Continue prenatal vitamins Reviewed n/v relief measures and warning s/s to report Reviewed recommended weight gain based on pre-gravid BMI Encouraged well-balanced diet Genetic & carrier screening discussed: requests Panorama,  Ultrasound discussed; fetal survey: requested CCNC  completed> form faxed if has or is planning to apply for medicaid The nature of [facility] with multiple MDs and other Advanced Practice Providers was explained to patient; also emphasized that fellows, residents, and students are part of our team.   Problem list reviewed and updated. 75% of 30 min visit spent on counseling and coordination of care.     Levie Heritage 12/26/2021

## 2021-12-27 LAB — CBC/D/PLT+RPR+RH+ABO+RUBIGG...
Antibody Screen: NEGATIVE
Basophils Absolute: 0.1 10*3/uL (ref 0.0–0.2)
Basos: 1 %
EOS (ABSOLUTE): 0 10*3/uL (ref 0.0–0.4)
Eos: 1 %
HCV Ab: NONREACTIVE
HIV Screen 4th Generation wRfx: NONREACTIVE
Hematocrit: 37.7 % (ref 34.0–46.6)
Hemoglobin: 12.3 g/dL (ref 11.1–15.9)
Hepatitis B Surface Ag: NEGATIVE
Immature Grans (Abs): 0 10*3/uL (ref 0.0–0.1)
Immature Granulocytes: 0 %
Lymphocytes Absolute: 2.6 10*3/uL (ref 0.7–3.1)
Lymphs: 30 %
MCH: 29.5 pg (ref 26.6–33.0)
MCHC: 32.6 g/dL (ref 31.5–35.7)
MCV: 90 fL (ref 79–97)
Monocytes Absolute: 0.4 10*3/uL (ref 0.1–0.9)
Monocytes: 5 %
Neutrophils Absolute: 5.5 10*3/uL (ref 1.4–7.0)
Neutrophils: 63 %
Platelets: 250 10*3/uL (ref 150–450)
RBC: 4.17 x10E6/uL (ref 3.77–5.28)
RDW: 12.8 % (ref 11.7–15.4)
RPR Ser Ql: NONREACTIVE
Rh Factor: POSITIVE
Rubella Antibodies, IGG: 7.77 index (ref >0.99)
WBC: 8.6 10*3/uL (ref 3.4–10.8)

## 2021-12-27 LAB — CERVICOVAGINAL ANCILLARY ONLY
Bacterial Vaginitis (gardnerella): NEGATIVE
Candida Glabrata: NEGATIVE
Candida Vaginitis: POSITIVE — AB
Chlamydia: NEGATIVE
Comment: NEGATIVE
Comment: NEGATIVE
Comment: NEGATIVE
Comment: NEGATIVE
Comment: NORMAL
Neisseria Gonorrhea: NEGATIVE

## 2021-12-27 LAB — HCV INTERPRETATION

## 2021-12-27 NOTE — Addendum Note (Signed)
Addended by: Levie Heritage on: 12/27/2021 09:19 AM   Modules accepted: Orders

## 2021-12-28 LAB — URINE CULTURE, OB REFLEX

## 2021-12-28 LAB — CULTURE, OB URINE

## 2021-12-31 MED ORDER — FLUCONAZOLE 150 MG PO TABS: 150.0000 mg | ORAL_TABLET | Freq: Once | ORAL | 0 refills | Status: AC

## 2021-12-31 NOTE — Addendum Note (Signed)
Addended by: Levie Heritage on: 12/31/2021 10:11 AM   Modules accepted: Orders

## 2022-01-03 LAB — PANORAMA PRENATAL TEST FULL PANEL:PANORAMA TEST PLUS 5 ADDITIONAL MICRODELETIONS: FETAL FRACTION: 6.8

## 2022-01-13 NOTE — L&D Delivery Note (Signed)
  Post-Placental IUD Insertion Procedure Note  Patient identified, informed consent signed prior to delivery, signed copy in chart, time out was performed.    Vaginal, labial and perineal areas thoroughly inspected for lacerations. Nolaceration identified prior to insertion of Paragard IUD.  - IUD grasped between sterile gloved fingers. Sterile lubrication applied to sterile gloved hand for ease of insertion. Fundus identified through abdominal wall using non-insertion hand. IUD inserted to fundus with bimanual technique. IUD carefully released at the fundus and insertion hand gently removed from vagina.   Patient tolerated procedure well.  Lot # [id number]  Expiration Date07/01/2027

## 2022-01-13 NOTE — L&D Delivery Note (Signed)
OB/GYN Faculty Practice Delivery Note  Brenda Mayo is a 27 y.o. G3P1011 s/p NSVD at [redacted]w[redacted]d. She was admitted for IOL for BPP 6/10 and decreased fetal movement.   ROM: 3h 24m with clear fluid GBS Status:  Negative/-- (05/14 1216) Maximum Maternal Temperature: 98.8 F    Labor Progress: Patient arrived at 1 cm dilation and was induced with misoprostol, foley balloon, pitocin, and AROM.   Delivery Date/Time: 06/04/2022 at 1654 Delivery: Called to room and patient had already delivered without any effort whatsoever just prior to me walking into the room. Baby was vigorous and crying on maternal abdomen. Cord clamped x 2 after 1-minute delay, and cut by FOB. Cord blood drawn. Placenta delivered spontaneously with gentle cord traction. Fundus firm with massage and Pitocin. Labia, perineum, vagina, and cervix inspected with no lacerations.  Patient desired a post placental Paragard IUD, placement was uncomplicated, see separate procedure note.   Placenta: 3v intact, to L&D Complications: precipitous delivery Lacerations: none EBL: 78 cc Analgesia: epidural   Infant: Baby girl  APGAR (1 MIN): 9   APGAR (5 MINS): 9    Weight: 3130 grams  Venora Maples, MD/MPH Attending Family Medicine Physician, [facility] for [facility], [facility]

## 2022-01-24 ENCOUNTER — Ambulatory Visit (INDEPENDENT_AMBULATORY_CARE_PROVIDER_SITE_OTHER): Payer: Medicaid Other | Admitting: Obstetrics and Gynecology

## 2022-01-24 VITALS — BP 106/62 | HR 94 | Wt 220.0 lb

## 2022-01-24 DIAGNOSIS — Z3A18 18 weeks gestation of pregnancy: Secondary | ICD-10-CM

## 2022-01-24 DIAGNOSIS — Z3482 Encounter for supervision of other normal pregnancy, second trimester: Secondary | ICD-10-CM

## 2022-01-24 DIAGNOSIS — Z3480 Encounter for supervision of other normal pregnancy, unspecified trimester: Secondary | ICD-10-CM

## 2022-01-24 NOTE — Progress Notes (Signed)
   PRENATAL VISIT NOTE  Subjective:  Brenda Mayo is a 27 y.o. G3P1011 at [redacted]w[redacted]d being seen today for ongoing prenatal care.  She is currently monitored for the following issues for this low-risk pregnancy and has Supervision of other normal pregnancy, antepartum and Placenta previa in second trimester on their problem list.  Patient reports no bleeding, no contractions, no cramping, and mild lower abdominal ache .  Contractions: Not present. Vag. Bleeding: None.  Movement: Present. Denies leaking of fluid.   Has maintained pelvic rest  The following portions of the patient's history were reviewed and updated as appropriate: allergies, current medications, past family history, past medical history, past social history, past surgical history and problem list.   Objective:   Vitals:   01/24/22 1058  BP: 106/62  Pulse: 94  Weight: 220 lb (99.8 kg)    Fetal Status: Fetal Heart Rate (bpm): 145   Movement: Present     General:  Alert, oriented and cooperative. Patient is in no acute distress.  Skin: Skin is warm and dry. No rash noted.   Cardiovascular: Normal heart rate noted  Respiratory: Normal respiratory effort, no problems with respiration noted  Abdomen: Soft, gravid, appropriate for gestational age.  Pain/Pressure: Present     Pelvic: Cervical exam deferred        Extremities: Normal range of motion.  Edema: None  Mental Status: Normal mood and affect. Normal behavior. Normal judgment and thought content.   Assessment and Plan:  Pregnancy: G3P1011 at [redacted]w[redacted]d 1. Supervision of other normal pregnancy, antepartum Normal FHR Reviewed finding of low placenta on prior US and need for evaluation on formal US during anatomy scan Reviewed recommendation for continued pelvic rest until placentation confirmed All questions answered  2. [redacted] weeks gestation of pregnancy Labs today  - AFP, Serum, Open Spina Bifida  Preterm labor symptoms and general obstetric precautions  including but not limited to vaginal bleeding, contractions, leaking of fluid and fetal movement were reviewed in detail with the patient. Please refer to After Visit Summary for other counseling recommendations.   No follow-ups on file.  Future Appointments  Date Time Provider [place]  01/31/2022  9:45 AM [facility] US5 [facility]  02/21/2022  9:35 AM Darliss Cheney, MD [facility] None  03/21/2022  8:35 AM Darliss Cheney, MD [facility] None  03/28/2022  9:55 AM Darliss Cheney, MD [facility] None  04/04/2022  9:35 AM Darliss Cheney, MD [facility] None    Darliss Cheney, MD

## 2022-01-26 LAB — AFP, SERUM, OPEN SPINA BIFIDA
AFP MoM: 0.42
AFP Value: 16.7 ng/mL
Gest. Age on Collection Date: 18.3 weeks
Maternal Age At EDD: 27.7 yr
OSBR Risk 1 IN: 10000
Test Results:: NEGATIVE
Weight: 220 [lb_av]

## 2022-01-29 ENCOUNTER — Other Ambulatory Visit: Payer: Self-pay

## 2022-01-29 ENCOUNTER — Telehealth: Payer: Self-pay

## 2022-01-29 DIAGNOSIS — O4400 Placenta previa specified as without hemorrhage, unspecified trimester: Secondary | ICD-10-CM

## 2022-01-29 NOTE — Telephone Encounter (Signed)
Spoke with patient about her appointment time change.  Will be here 15 minutes prior to appointment.

## 2022-01-31 ENCOUNTER — Other Ambulatory Visit: Payer: Self-pay | Admitting: *Deleted

## 2022-01-31 ENCOUNTER — Ambulatory Visit: Payer: Medicaid Other | Attending: Maternal & Fetal Medicine

## 2022-01-31 ENCOUNTER — Ambulatory Visit: Payer: Medicaid Other | Admitting: *Deleted

## 2022-01-31 ENCOUNTER — Encounter: Payer: Self-pay | Admitting: *Deleted

## 2022-01-31 VITALS — BP 122/65 | HR 83

## 2022-01-31 DIAGNOSIS — O4402 Placenta previa specified as without hemorrhage, second trimester: Secondary | ICD-10-CM | POA: Diagnosis not present

## 2022-01-31 DIAGNOSIS — Z3689 Encounter for other specified antenatal screening: Secondary | ICD-10-CM | POA: Insufficient documentation

## 2022-01-31 DIAGNOSIS — D573 Sickle-cell trait: Secondary | ICD-10-CM | POA: Diagnosis not present

## 2022-01-31 DIAGNOSIS — O4400 Placenta previa specified as without hemorrhage, unspecified trimester: Secondary | ICD-10-CM | POA: Insufficient documentation

## 2022-01-31 DIAGNOSIS — Z3A19 19 weeks gestation of pregnancy: Secondary | ICD-10-CM | POA: Diagnosis not present

## 2022-01-31 DIAGNOSIS — Z362 Encounter for other antenatal screening follow-up: Secondary | ICD-10-CM

## 2022-02-21 ENCOUNTER — Ambulatory Visit (INDEPENDENT_AMBULATORY_CARE_PROVIDER_SITE_OTHER): Payer: Medicaid Other | Admitting: Obstetrics and Gynecology

## 2022-02-21 VITALS — BP 113/62 | HR 80 | Wt 227.0 lb

## 2022-02-21 DIAGNOSIS — Z3A22 22 weeks gestation of pregnancy: Secondary | ICD-10-CM

## 2022-02-21 DIAGNOSIS — Z3480 Encounter for supervision of other normal pregnancy, unspecified trimester: Secondary | ICD-10-CM

## 2022-02-21 DIAGNOSIS — Z3482 Encounter for supervision of other normal pregnancy, second trimester: Secondary | ICD-10-CM

## 2022-02-21 NOTE — Progress Notes (Unsigned)
   PRENATAL VISIT NOTE  Subjective:  Brenda Mayo is a 27 y.o. G3P1011 at [redacted]w[redacted]d being seen today for ongoing prenatal care.  She is currently monitored for the following issues for this {Blank single:19197::"high-risk","low-risk"} pregnancy and has Supervision of other normal pregnancy, antepartum and Placenta previa in second trimester on their problem list.  Patient reports {sx:14538}.  Contractions: Not present. Vag. Bleeding: None.  Movement: Present. Denies leaking of fluid.   The following portions of the patient's history were reviewed and updated as appropriate: allergies, current medications, past family history, past medical history, past social history, past surgical history and problem list.   Objective:   Vitals:   02/21/22 0925  BP: 113/62  Pulse: 80  Weight: 227 lb (103 kg)    Fetal Status:     Movement: Present     General:  Alert, oriented and cooperative. Patient is in no acute distress.  Skin: Skin is warm and dry. No rash noted.   Cardiovascular: Normal heart rate noted  Respiratory: Normal respiratory effort, no problems with respiration noted  Abdomen: Soft, gravid, appropriate for gestational age.  Pain/Pressure: Present ("round ligament:)     Pelvic: {Blank single:19197::"Cervical exam performed in the presence of a chaperone","Cervical exam deferred"}        Extremities: Normal range of motion.  Edema: None  Mental Status: Normal mood and affect. Normal behavior. Normal judgment and thought content.   Assessment and Plan:  Pregnancy: G3P1011 at [redacted]w[redacted]d 1. Supervision of other normal pregnancy, antepartum ***  2. [redacted] weeks gestation of pregnancy ***  {Blank single:19197::"Term","Preterm"} labor symptoms and general obstetric precautions including but not limited to vaginal bleeding, contractions, leaking of fluid and fetal movement were reviewed in detail with the patient. Please refer to After Visit Summary for other counseling  recommendations.   No follow-ups on file.  Future Appointments  Date Time Provider [place]  03/07/2022 10:30 AM [facility] NURSE [facility]  03/07/2022 10:45 AM [facility] US6 [facility]  03/21/2022  8:35 AM Darliss Cheney, MD [facility] None  03/28/2022  9:55 AM Darliss Cheney, MD [facility] None  04/04/2022  9:35 AM Darliss Cheney, MD [facility] None    Darliss Cheney, MD

## 2022-03-07 ENCOUNTER — Ambulatory Visit: Payer: Medicaid Other | Attending: Maternal & Fetal Medicine

## 2022-03-07 ENCOUNTER — Ambulatory Visit: Payer: Medicaid Other | Admitting: *Deleted

## 2022-03-07 VITALS — BP 134/66 | HR 86

## 2022-03-07 DIAGNOSIS — Z3A24 24 weeks gestation of pregnancy: Secondary | ICD-10-CM

## 2022-03-07 DIAGNOSIS — O4402 Placenta previa specified as without hemorrhage, second trimester: Secondary | ICD-10-CM

## 2022-03-07 DIAGNOSIS — O99019 Anemia complicating pregnancy, unspecified trimester: Secondary | ICD-10-CM | POA: Diagnosis present

## 2022-03-07 DIAGNOSIS — D573 Sickle-cell trait: Secondary | ICD-10-CM

## 2022-03-07 DIAGNOSIS — Z362 Encounter for other antenatal screening follow-up: Secondary | ICD-10-CM | POA: Diagnosis present

## 2022-03-11 ENCOUNTER — Encounter: Payer: Self-pay | Admitting: Obstetrics and Gynecology

## 2022-03-21 ENCOUNTER — Ambulatory Visit (INDEPENDENT_AMBULATORY_CARE_PROVIDER_SITE_OTHER): Payer: Medicaid Other | Admitting: Obstetrics and Gynecology

## 2022-03-21 ENCOUNTER — Encounter: Payer: Self-pay | Admitting: Obstetrics and Gynecology

## 2022-03-21 VITALS — BP 104/62 | HR 92 | Wt 241.0 lb

## 2022-03-21 DIAGNOSIS — Z3482 Encounter for supervision of other normal pregnancy, second trimester: Secondary | ICD-10-CM

## 2022-03-21 DIAGNOSIS — Z3A26 26 weeks gestation of pregnancy: Secondary | ICD-10-CM

## 2022-03-21 DIAGNOSIS — Z3480 Encounter for supervision of other normal pregnancy, unspecified trimester: Secondary | ICD-10-CM

## 2022-03-21 NOTE — Progress Notes (Signed)
Pt presents for ROB visit. Pt unable to do GTT today. No concerns at this time.

## 2022-03-21 NOTE — Progress Notes (Signed)
   PRENATAL VISIT NOTE  Subjective:  Brenda Mayo is a 27 y.o. G3P1011 at [redacted]w[redacted]d being seen today for ongoing prenatal care.  She is currently monitored for the following issues for this low-risk pregnancy and has Supervision of other normal pregnancy, antepartum and Placenta previa in second trimester on their problem list.  Patient reports no complaints.  Contractions: Not present. Vag. Bleeding: None.  Movement: Present. Denies leaking of fluid.   The following portions of the patient's history were reviewed and updated as appropriate: allergies, current medications, past family history, past medical history, past social history, past surgical history and problem list.   Objective:   Vitals:   03/21/22 0838  BP: 104/62  Pulse: 92  Weight: 241 lb (109.3 kg)    Fetal Status: Fetal Heart Rate (bpm): 145   Movement: Present     General:  Alert, oriented and cooperative. Patient is in no acute distress.  Skin: Skin is warm and dry. No rash noted.   Cardiovascular: Normal heart rate noted  Respiratory: Normal respiratory effort, no problems with respiration noted  Abdomen: Soft, gravid, appropriate for gestational age.  Pain/Pressure: Absent     Pelvic: Cervical exam deferred        Extremities: Normal range of motion.  Edema: None  Mental Status: Normal mood and affect. Normal behavior. Normal judgment and thought content.   Assessment and Plan:  Pregnancy: G3P1011 at [redacted]w[redacted]d 1. Supervision of other normal pregnancy, antepartum Answered all questions Encouraged to thoroughly heat up sandwiches  2. [redacted] weeks gestation of pregnancy Unable to complete GTT today, will return next week   Preterm labor symptoms and general obstetric precautions including but not limited to vaginal bleeding, contractions, leaking of fluid and fetal movement were reviewed in detail with the patient. Please refer to After Visit Summary for other counseling recommendations.   No follow-ups on  file.  Future Appointments  Date Time Provider [place]  04/04/2022  9:35 AM Darliss Cheney, MD [facility] None  04/18/2022  9:35 AM Darliss Cheney, MD [facility] None  05/02/2022  9:35 AM Darliss Cheney, MD [facility] None  05/16/2022  9:35 AM Darliss Cheney, MD [facility] None    Darliss Cheney, MD

## 2022-03-24 ENCOUNTER — Other Ambulatory Visit: Payer: Medicaid Other

## 2022-03-28 ENCOUNTER — Encounter: Payer: Medicaid Other | Admitting: Obstetrics and Gynecology

## 2022-04-04 ENCOUNTER — Encounter: Payer: Self-pay | Admitting: General Practice

## 2022-04-04 ENCOUNTER — Ambulatory Visit (INDEPENDENT_AMBULATORY_CARE_PROVIDER_SITE_OTHER): Payer: Medicaid Other | Admitting: Obstetrics and Gynecology

## 2022-04-04 VITALS — BP 118/64 | HR 85 | Wt 246.0 lb

## 2022-04-04 DIAGNOSIS — Z3480 Encounter for supervision of other normal pregnancy, unspecified trimester: Secondary | ICD-10-CM

## 2022-04-04 DIAGNOSIS — Z3A28 28 weeks gestation of pregnancy: Secondary | ICD-10-CM

## 2022-04-04 DIAGNOSIS — Z1339 Encounter for screening examination for other mental health and behavioral disorders: Secondary | ICD-10-CM | POA: Diagnosis not present

## 2022-04-04 DIAGNOSIS — Z3483 Encounter for supervision of other normal pregnancy, third trimester: Secondary | ICD-10-CM

## 2022-04-04 NOTE — Progress Notes (Signed)
   PRENATAL VISIT NOTE  Subjective:  Brenda Mayo is a 27 y.o. G3P1011 at [redacted]w[redacted]d being seen today for ongoing prenatal care.  She is currently monitored for the following issues for this low-risk pregnancy and has Supervision of other normal pregnancy, antepartum and Placenta previa in second trimester on their problem list.  Patient reports no complaints.  Contractions: Not present (Pt reports feeling 2 ctx like cramps after" doing alot yesterday"). Vag. Bleeding: None.  Movement: Present. Denies leaking of fluid.   Taking a break from the sandwiches. Reports often getting hot at work when standing for a while and needing to sit to cool down - requesting note to allow for sitting breaks  The following portions of the patient's history were reviewed and updated as appropriate: allergies, current medications, past family history, past medical history, past social history, past surgical history and problem list.   Objective:   Vitals:   04/04/22 0950  BP: 118/64  Pulse: 85  Weight: 246 lb (111.6 kg)    Fetal Status: Fetal Heart Rate (bpm): 150   Movement: Present     General:  Alert, oriented and cooperative. Patient is in no acute distress.  Skin: Skin is warm and dry. No rash noted.   Cardiovascular: Normal heart rate noted  Respiratory: Normal respiratory effort, no problems with respiration noted  Abdomen: Soft, gravid, appropriate for gestational age.  Pain/Pressure: Present     Pelvic: Cervical exam deferred        Extremities: Normal range of motion.  Edema: None  Mental Status: Normal mood and affect. Normal behavior. Normal judgment and thought content.   Assessment and Plan:  Pregnancy: G3P1011 at [redacted]w[redacted]d 1. Supervision of other normal pregnancy, antepartum Third tri testing today - Glucose Tolerance, 2 Hours w/1 Hour - RPR - HIV antibody (with reflex) - CBC  2. [redacted] weeks gestation of pregnancy Answered all questions regarding testing - Glucose Tolerance, 2  Hours w/1 Hour - RPR - HIV antibody (with reflex) - CBC  Note for work stateing she can sit prn   Preterm labor symptoms and general obstetric precautions including but not limited to vaginal bleeding, contractions, leaking of fluid and fetal movement were reviewed in detail with the patient. Please refer to After Visit Summary for other counseling recommendations.   No follow-ups on file.  Future Appointments  Date Time Provider [place]  04/18/2022  9:35 AM Darliss Cheney, MD [facility] None  05/02/2022  9:35 AM Darliss Cheney, MD [facility] None  05/16/2022  9:35 AM Darliss Cheney, MD [facility] None    Darliss Cheney, MD

## 2022-04-05 LAB — CBC
Hematocrit: 33.9 % — ABNORMAL LOW (ref 34.0–46.6)
Hemoglobin: 11.2 g/dL (ref 11.1–15.9)
MCH: 29.3 pg (ref 26.6–33.0)
MCHC: 33 g/dL (ref 31.5–35.7)
MCV: 89 fL (ref 79–97)
Platelets: 228 10*3/uL (ref 150–450)
RBC: 3.82 x10E6/uL (ref 3.77–5.28)
RDW: 12.9 % (ref 11.7–15.4)
WBC: 10.8 10*3/uL (ref 3.4–10.8)

## 2022-04-05 LAB — GLUCOSE TOLERANCE, 2 HOURS W/ 1HR
Glucose, 1 hour: 106 mg/dL (ref 70–179)
Glucose, 2 hour: 85 mg/dL (ref 70–152)
Glucose, Fasting: 81 mg/dL (ref 70–91)

## 2022-04-05 LAB — HIV ANTIBODY (ROUTINE TESTING W REFLEX): HIV Screen 4th Generation wRfx: NONREACTIVE

## 2022-04-05 LAB — RPR: RPR Ser Ql: NONREACTIVE

## 2022-04-18 ENCOUNTER — Encounter: Payer: Medicaid Other | Admitting: Obstetrics and Gynecology

## 2022-04-18 ENCOUNTER — Encounter: Payer: Self-pay | Admitting: General Practice

## 2022-04-29 ENCOUNTER — Ambulatory Visit (INDEPENDENT_AMBULATORY_CARE_PROVIDER_SITE_OTHER): Payer: Medicaid Other

## 2022-04-29 VITALS — BP 120/72 | HR 88

## 2022-04-29 DIAGNOSIS — O26899 Other specified pregnancy related conditions, unspecified trimester: Secondary | ICD-10-CM

## 2022-04-29 DIAGNOSIS — Z3480 Encounter for supervision of other normal pregnancy, unspecified trimester: Secondary | ICD-10-CM

## 2022-04-29 DIAGNOSIS — R350 Frequency of micturition: Secondary | ICD-10-CM

## 2022-04-29 DIAGNOSIS — R195 Other fecal abnormalities: Secondary | ICD-10-CM

## 2022-04-29 DIAGNOSIS — O4402 Placenta previa specified as without hemorrhage, second trimester: Secondary | ICD-10-CM

## 2022-04-29 DIAGNOSIS — O4403 Placenta previa specified as without hemorrhage, third trimester: Secondary | ICD-10-CM

## 2022-04-29 DIAGNOSIS — Z3A32 32 weeks gestation of pregnancy: Secondary | ICD-10-CM

## 2022-04-29 LAB — POCT URINALYSIS DIPSTICK OB
Glucose, UA: NEGATIVE
POC,PROTEIN,UA: NEGATIVE

## 2022-04-29 NOTE — Progress Notes (Signed)
LOW-RISK PREGNANCY OFFICE VISIT  Patient name: Brenda Brenda MRN [id number]  Date of birth: [birth year] Chief Complaint:   Routine Prenatal Visit  Subjective:   Brenda Brenda is a 27 y.o. G79P1011 female at [redacted]w[redacted]d with an Estimated Date of Delivery: 06/24/22 being seen today for ongoing management of a low-risk pregnancy aeb has Supervision of other normal pregnancy, antepartum and Placenta previa in second trimester on their problem list.  Patient presents today, alone, with fatigue and loose stools .  Patient states she has been experiencing symptoms for the past 3 days with loose stools up to 4x/day.  She states she has also had a cough for the past week, but "doesn't feel sick but has taken Robitussin."  However, she denies being around sick individuals or of having sick individuals in the home.  She also reports intermittent cramping that occurs w and w/o back pain.  She expresses concern for PTL. Patient endorses fetal movement.  Patient denies vaginal concerns including abnormal discharge, leaking of fluid, and bleeding. She also reports increased urine frequency, lightening crotch, and dizziness.    Contractions: Irritability (having lots of braxton hicks ctx).  .  Movement: Present.  Reviewed past medical,surgical, social, obstetrical and family history as well as problem list, medications and allergies.  Objective   Vitals:   04/29/22 1006  BP: 120/72  Pulse: 88  There is no height or weight on file to calculate BMI.  Total Weight Gain:46 lb (20.9 kg)         Physical Examination:   General appearance: Well appearing, and in no distress  Mental status: Alert, oriented to person, place, and time  Skin: Warm & dry  Cardiovascular: Normal heart rate noted  Respiratory: Normal respiratory effort, no distress  Abdomen: Soft, gravid, nontender, AGA with    Pelvic: Cervical exam deferred           Extremities:    Fetal Status: Fetal Heart Rate (bpm): 130   Movement: Present   Results for orders placed or performed in visit on 04/29/22 (from the past 24 hour(s))  POC Urinalysis Dipstick OB   Collection Time: 04/29/22 10:29 AM  Result Value Ref Range   Color, UA     Clarity, UA     Glucose, UA Negative Negative   Bilirubin, UA     Ketones, UA     Spec Grav, UA     Blood, UA     pH, UA     POC,PROTEIN,UA Negative Negative, Trace, Small (1+), Moderate (2+), Large (3+), 4+   Urobilinogen, UA     Nitrite, UA     Leukocytes, UA     Appearance     Odor      Assessment & Plan:  Low-risk pregnancy of a 27 y.o., G3P1011 at [redacted]w[redacted]d with an Estimated Date of Delivery: 06/24/22   1. Supervision of other normal pregnancy, antepartum -Anticipatory guidance for upcoming appts. -Patient to schedule next appt in 2 weeks for an in-person visit.  2. [redacted] weeks gestation of pregnancy -Reviewed complaints. -Reassured that symptoms are notable, but not c/w PTL d/t frequency (4 ctx per day). -Reviewed PTL s/s and encouraged to continue to monitor.   3. Loose stools -Encouraged imodium usage. -Discussed increased hydration and bulk fiber intake. -Information placed in AVS.  4. Placenta previa in second trimester -Reviewed chart and patient reports previa has resolved.  -MFM notes not identifying previa x 2. -PL updated  5. Increased urinary frequency during pregnancy -UA  normal. -Denies pain or discomfort.  -Further reassured that frequency is c/w growing uterus.     Meds: No orders of the defined types were placed in this encounter.  Labs/procedures today: Lab Orders         POC Urinalysis Dipstick OB      Reviewed: Preterm labor symptoms and general obstetric precautions including but not limited to vaginal bleeding, contractions, leaking of fluid and fetal movement were reviewed in detail with the patient.  All questions were answered.  Follow-up: No follow-ups on file.  Orders Placed This Encounter  Procedures   POC Urinalysis  Dipstick OB   Cherre Robins MSN, CNM 04/29/2022

## 2022-04-29 NOTE — Progress Notes (Signed)
Patient states she is extremely tired and having loose stools. [name] RN

## 2022-05-02 ENCOUNTER — Encounter: Payer: Medicaid Other | Admitting: Obstetrics and Gynecology

## 2022-05-08 ENCOUNTER — Telehealth: Payer: Self-pay

## 2022-05-08 NOTE — Telephone Encounter (Signed)
Patient called the office stating she had an episode of vomiting and then shortly after she had a bowel movement. She denies leakage of fluids and vaginal bleeding. Patient states she ate a bowl of cereal last night and she denies being around anyone who's sick. She states she started having braxton hicks for an hour and they lasted for 10 minutes each time. She states she was concerned about being dehydrated because she vomited and started having contractions afterwards. Advised patient to increase water intake and try lying on her side for an hour. If contractions start coming 3-5 minutes, she should go to [facility] at [facility]. Understanding was voiced. Humza Tallerico l Bevin Das, CMA

## 2022-05-12 ENCOUNTER — Telehealth: Payer: Self-pay

## 2022-05-12 NOTE — Telephone Encounter (Signed)
Patient called complaining that she thinks she has food poisoning and desires appointment today. Patient is [redacted] weeks pregnant and also complaining of frequent contractions.  Explained to patient she is most likely dehydrated and that is causing the more frequent contractions. Needs to bee seen now at maternity admissions unit for evaluation and gave patient address. Patient states understanding and agreeable to plan. [name] RN

## 2022-05-16 ENCOUNTER — Encounter: Payer: Medicaid Other | Admitting: Obstetrics and Gynecology

## 2022-05-27 ENCOUNTER — Encounter: Payer: Self-pay | Admitting: Obstetrics and Gynecology

## 2022-05-27 ENCOUNTER — Ambulatory Visit (INDEPENDENT_AMBULATORY_CARE_PROVIDER_SITE_OTHER): Payer: Medicaid Other | Admitting: Obstetrics and Gynecology

## 2022-05-27 ENCOUNTER — Other Ambulatory Visit (HOSPITAL_COMMUNITY)
Admission: RE | Admit: 2022-05-27 | Discharge: 2022-05-27 | Disposition: A | Discharge status: Home / Self Care | Payer: Medicaid Other | Source: Ambulatory Visit | Attending: Obstetrics and Gynecology | Admitting: Obstetrics and Gynecology

## 2022-05-27 VITALS — BP 101/76 | HR 98 | Wt 264.0 lb

## 2022-05-27 DIAGNOSIS — Z87440 Personal history of urinary (tract) infections: Secondary | ICD-10-CM

## 2022-05-27 DIAGNOSIS — Z3A36 36 weeks gestation of pregnancy: Secondary | ICD-10-CM

## 2022-05-27 DIAGNOSIS — Z3493 Encounter for supervision of normal pregnancy, unspecified, third trimester: Secondary | ICD-10-CM | POA: Insufficient documentation

## 2022-05-27 DIAGNOSIS — Z3480 Encounter for supervision of other normal pregnancy, unspecified trimester: Secondary | ICD-10-CM

## 2022-05-27 LAB — POCT URINALYSIS DIPSTICK OB
Bilirubin, UA: NEGATIVE
Blood, UA: NEGATIVE
Glucose, UA: NEGATIVE
Ketones, UA: NEGATIVE
Nitrite, UA: NEGATIVE
POC,PROTEIN,UA: NEGATIVE
Spec Grav, UA: 1.01 (ref 1.010–1.025)
Urobilinogen, UA: 0.2 E.U./dL
pH, UA: 6 (ref 5.0–8.0)

## 2022-05-27 NOTE — Progress Notes (Signed)
   PRENATAL VISIT NOTE  Subjective:  Brenda Mayo is a 27 y.o. G3P1011 at [redacted]w[redacted]d being seen today for ongoing prenatal care.  She is currently monitored for the following issues for this low-risk pregnancy and has Supervision of other normal pregnancy, antepartum and Placenta previa in second trimester on their problem list.  Patient reports no complaints.  Contractions: Irritability. Vag. Bleeding: None.  Movement: Present. Denies leaking of fluid.   The following portions of the patient's history were reviewed and updated as appropriate: allergies, current medications, past family history, past medical history, past social history, past surgical history and problem list.   Objective:   Vitals:   05/27/22 1121  BP: 101/76  Pulse: 98  Weight: 264 lb (119.7 kg)    Fetal Status: Fetal Heart Rate (bpm): 141 Fundal Height: 36 cm Movement: Present  Presentation: Vertex  General:  Alert, oriented and cooperative. Patient is in no acute distress.  Skin: Skin is warm and dry. No rash noted.   Cardiovascular: Normal heart rate noted  Respiratory: Normal respiratory effort, no problems with respiration noted  Abdomen: Soft, gravid, appropriate for gestational age.  Pain/Pressure: Present     Pelvic: Cervical exam deferred        Extremities: Normal range of motion.  Edema: None  Mental Status: Normal mood and affect. Normal behavior. Normal judgment and thought content.   Assessment and Plan:  Pregnancy: G3P1011 at [redacted]w[redacted]d 1. [redacted] weeks gestation of pregnancy 1. Supervision of low-risk pregnancy, third trimester BP and FHR normal Feeling vigorous movement  2. [redacted] weeks gestation of pregnancy Routine swabs collected today Had questions about labor, pain management, induction vs SOL. Lengthy discussion about options, all questions answered Has been going to [facility] ED for concerns during pregnancy, instructed her about [facility].   Preterm labor symptoms and general obstetric  precautions including but not limited to vaginal bleeding, contractions, leaking of fluid and fetal movement were reviewed in detail with the patient. Please refer to After Visit Summary for other counseling recommendations.   Return in about 1 week (around 06/03/2022) for OB VISIT (MD or APP).  Future Appointments  Date Time Provider Department Center  06/03/2022 11:15 AM Gerrit Heck, CNM [facility] None  06/12/2022 11:15 AM Adrian Blackwater, Rhona Raider, DO [facility] None    Albertine Grates, FNP

## 2022-05-27 NOTE — Addendum Note (Signed)
Addended by: Lorelle Gibbs L on: 05/27/2022 01:25 PM   Modules accepted: Orders

## 2022-05-28 LAB — GC/CHLAMYDIA PROBE AMP (~~LOC~~) NOT AT ARMC
Chlamydia: NEGATIVE
Comment: NEGATIVE
Comment: NORMAL
Neisseria Gonorrhea: NEGATIVE

## 2022-05-30 ENCOUNTER — Encounter: Payer: Medicaid Other | Admitting: Obstetrics and Gynecology

## 2022-05-31 LAB — CULTURE, BETA STREP (GROUP B ONLY): Strep Gp B Culture: NEGATIVE

## 2022-06-03 ENCOUNTER — Inpatient Hospital Stay (HOSPITAL_COMMUNITY)
Admission: AD | Admit: 2022-06-03 | Discharge: 2022-06-05 | DRG: 807 | Disposition: A | Discharge status: Home / Self Care | Payer: Medicaid Other | Attending: Obstetrics & Gynecology | Admitting: Obstetrics & Gynecology

## 2022-06-03 ENCOUNTER — Encounter (HOSPITAL_COMMUNITY): Payer: Self-pay | Admitting: Obstetrics and Gynecology

## 2022-06-03 ENCOUNTER — Inpatient Hospital Stay (HOSPITAL_BASED_OUTPATIENT_CLINIC_OR_DEPARTMENT_OTHER): Payer: Medicaid Other

## 2022-06-03 ENCOUNTER — Ambulatory Visit (INDEPENDENT_AMBULATORY_CARE_PROVIDER_SITE_OTHER): Payer: Medicaid Other

## 2022-06-03 ENCOUNTER — Other Ambulatory Visit: Payer: Self-pay

## 2022-06-03 VITALS — BP 108/72 | HR 94 | Wt 262.0 lb

## 2022-06-03 DIAGNOSIS — D573 Sickle-cell trait: Secondary | ICD-10-CM | POA: Diagnosis present

## 2022-06-03 DIAGNOSIS — Z3A37 37 weeks gestation of pregnancy: Secondary | ICD-10-CM

## 2022-06-03 DIAGNOSIS — Z975 Presence of (intrauterine) contraceptive device: Secondary | ICD-10-CM

## 2022-06-03 DIAGNOSIS — O368130 Decreased fetal movements, third trimester, not applicable or unspecified: Secondary | ICD-10-CM

## 2022-06-03 DIAGNOSIS — O368131 Decreased fetal movements, third trimester, fetus 1: Secondary | ICD-10-CM | POA: Diagnosis not present

## 2022-06-03 DIAGNOSIS — O9902 Anemia complicating childbirth: Secondary | ICD-10-CM | POA: Diagnosis present

## 2022-06-03 DIAGNOSIS — Z30430 Encounter for insertion of intrauterine contraceptive device: Secondary | ICD-10-CM | POA: Diagnosis not present

## 2022-06-03 LAB — CBC
HCT: 36 % (ref 36.0–46.0)
Hemoglobin: 11 g/dL — ABNORMAL LOW (ref 12.0–15.0)
MCH: 25.6 pg — ABNORMAL LOW (ref 26.0–34.0)
MCHC: 30.6 g/dL (ref 30.0–36.0)
MCV: 83.9 fL (ref 80.0–100.0)
Platelets: 235 10*3/uL (ref 150–400)
RBC: 4.29 MIL/uL (ref 3.87–5.11)
RDW: 15.9 % — ABNORMAL HIGH (ref 11.5–15.5)
WBC: 7.8 10*3/uL (ref 4.0–10.5)
nRBC: 0 % (ref 0.0–0.2)

## 2022-06-03 LAB — TYPE AND SCREEN
ABO/RH(D): O POS
Antibody Screen: NEGATIVE

## 2022-06-03 MED ORDER — MISOPROSTOL 50MCG HALF TABLET
50.0000 ug | ORAL_TABLET | Freq: Once | ORAL | Status: AC
  Administered 2022-06-03: 50 ug via ORAL
  Filled 2022-06-03: qty 1

## 2022-06-03 MED ORDER — LACTATED RINGERS IV SOLN: INTRAVENOUS | Status: DC

## 2022-06-03 MED ORDER — PHENYLEPHRINE 80 MCG/ML (10ML) SYRINGE FOR IV PUSH (FOR BLOOD PRESSURE SUPPORT): 80.0000 ug | PREFILLED_SYRINGE | INTRAVENOUS | Status: DC | PRN

## 2022-06-03 MED ORDER — LACTATED RINGERS IV SOLN: 500.0000 mL | Freq: Once | INTRAVENOUS | Status: DC

## 2022-06-03 MED ORDER — MISOPROSTOL 25 MCG QUARTER TABLET: 25.0000 ug | ORAL_TABLET | Freq: Once | ORAL | Status: DC

## 2022-06-03 MED ORDER — PARAGARD INTRAUTERINE COPPER IU IUD
1.0000 | INTRAUTERINE_SYSTEM | Freq: Once | INTRAUTERINE | Status: AC
  Administered 2022-06-04: 1 via INTRAUTERINE
  Filled 2022-06-03: qty 1

## 2022-06-03 MED ORDER — SOD CITRATE-CITRIC ACID 500-334 MG/5ML PO SOLN: 30.0000 mL | ORAL | Status: DC | PRN

## 2022-06-03 MED ORDER — MISOPROSTOL 25 MCG QUARTER TABLET
25.0000 ug | ORAL_TABLET | Freq: Once | ORAL | Status: AC
  Administered 2022-06-03: 25 ug via VAGINAL
  Filled 2022-06-03: qty 1

## 2022-06-03 MED ORDER — ACETAMINOPHEN 325 MG PO TABS: 650.0000 mg | ORAL_TABLET | ORAL | Status: DC | PRN

## 2022-06-03 MED ORDER — LACTATED RINGERS IV SOLN: 500.0000 mL | INTRAVENOUS | Status: DC | PRN

## 2022-06-03 MED ORDER — TERBUTALINE SULFATE 1 MG/ML IJ SOLN: 0.2500 mg | Freq: Once | INTRAMUSCULAR | Status: DC | PRN

## 2022-06-03 MED ORDER — EPHEDRINE 5 MG/ML INJ: 10.0000 mg | INTRAVENOUS | Status: DC | PRN

## 2022-06-03 MED ORDER — OXYTOCIN BOLUS FROM INFUSION
333.0000 mL | Freq: Once | INTRAVENOUS | Status: AC
  Administered 2022-06-04: 333 mL via INTRAVENOUS

## 2022-06-03 MED ORDER — ONDANSETRON HCL 4 MG/2ML IJ SOLN
4.0000 mg | Freq: Four times a day (QID) | INTRAMUSCULAR | Status: DC | PRN
  Administered 2022-06-04: 4 mg via INTRAVENOUS
  Filled 2022-06-03: qty 2

## 2022-06-03 MED ORDER — OXYTOCIN-SODIUM CHLORIDE 30-0.9 UT/500ML-% IV SOLN
2.5000 [IU]/h | INTRAVENOUS | Status: DC
  Filled 2022-06-03: qty 500

## 2022-06-03 MED ORDER — DIPHENHYDRAMINE HCL 50 MG/ML IJ SOLN: 12.5000 mg | INTRAMUSCULAR | Status: DC | PRN

## 2022-06-03 MED ORDER — FENTANYL-BUPIVACAINE-NACL 0.5-0.125-0.9 MG/250ML-% EP SOLN
12.0000 mL/h | EPIDURAL | Status: DC | PRN
  Administered 2022-06-04: 12 mL/h via EPIDURAL
  Filled 2022-06-03: qty 250

## 2022-06-03 MED ORDER — LIDOCAINE HCL (PF) 1 % IJ SOLN: 30.0000 mL | INTRAMUSCULAR | Status: DC | PRN

## 2022-06-03 MED ORDER — MISOPROSTOL 25 MCG QUARTER TABLET
25.0000 ug | ORAL_TABLET | ORAL | Status: DC | PRN
  Administered 2022-06-03: 25 ug via VAGINAL
  Filled 2022-06-03: qty 1

## 2022-06-03 NOTE — Progress Notes (Signed)
Patient ID: Brenda Mayo, female   DOB: 08-09-1995, 27 y.o.   MRN: [id number] LABOR NOTE Aldonia Quenneville is a 27 y.o. G3P1011 at [redacted]w[redacted]d admitted for induction of labor due to DFM x2d and BPP 6/10  Subjective: feeling contractions some, but not uncomfortable  Objective: BP 130/84   Pulse 97   Temp 98.2 F (36.8 C) (Oral)   Resp 16   LMP 09/17/2021 (Within Days)  No intake/output data recorded.  FHR baseline 140 bpm, Variability: moderate, Accelerations:present, Decelerations:  Absent Toco: irregular   SVE:   1/50/ballotable, vtx Attempted regular foley w/o success Cooks cervical foley bulb inserted and inflated w/ 40ml LR w/o difficulty   Labs: Lab Results  Component Value Date   WBC 7.8 06/03/2022   HGB 11.0 (L) 06/03/2022   HCT 36.0 06/03/2022   MCV 83.9 06/03/2022   PLT 235 06/03/2022    Assessment / Plan: IOL d/t DFM x 2d and BPP 6/10. S/P cytotec 50/63mcg at 1755, cx unchanged. Had initially wanted to avoid foley bulb, states didn't work last time-but is open to trying it again. Cooks foley placed. Will continue vaginal cytotec q4hr until foley out.  Labor: cervical ripening phase Fetal Wellbeing:  Category I Pain Control:  labor support without medications Pre-eclampsia: N/A I/D:  GBS neg Anticipated MOD: NSVB  Cheral Marker CNM, WHNP-BC 06/03/2022, 10:25 PM

## 2022-06-03 NOTE — Progress Notes (Signed)
   LOW-RISK PREGNANCY OFFICE VISIT  Patient name: Brenda Mayo MRN [id number]  Date of birth: 01/30/1995 Chief Complaint:   Routine Prenatal Visit  Subjective:   Brenda Mayo is a 27 y.o. G73P1011 female at [redacted]w[redacted]d with an Estimated Date of Delivery: 06/24/22 being seen today for ongoing management of a low-risk pregnancy aeb has Supervision of other normal pregnancy, antepartum and Placenta previa in second trimester on their problem list.  Patient presents today, alone, with  decreased fetal movement . Patient states she has not felt movement in 2 days. She states she did not do any interventions to promote movement as she felt it was due to fetal growth causing restriction. Patient endorses fetal movement. Patient reports abdominal cramping or contractions.  Patient denies vaginal concerns including abnormal discharge, leaking of fluid, and bleeding. She reports "excruciating pain" in vaginal area that she describes as feeling like she is being "ripped apart." She reports it is improved with laying down and usage of peanut ball. No issues with urination, constipation, or diarrhea.    Contractions: Irritability. Vag. Bleeding: None.  Movement: (!) Decreased.  Reviewed past medical,surgical, social, obstetrical and family history as well as problem list, medications and allergies.  Objective   Vitals:   06/03/22 1115  BP: 108/72  Pulse: 94  Weight: 262 lb (118.8 kg)  Body mass index is 37.59 kg/m.  Total Weight Gain:62 lb (28.1 kg)         Physical Examination:   General appearance: Well appearing, and in no distress  Mental status: Alert, oriented to person, place, and time  Skin: Warm & dry  Cardiovascular: Normal heart rate noted  Respiratory: Normal respiratory effort, no distress  Abdomen: Soft, gravid, nontender, AGA with    Pelvic: Cervical exam deferred           Extremities: Edema: None  Fetal Status:    Movement: (!) Decreased   No results found  for this or any previous visit (from the past 24 hour(s)).  Assessment & Plan:  High-risk pregnancy of a 27 y.o., G3P1011 at [redacted]w[redacted]d with an Estimated Date of Delivery: 06/24/22   1. [redacted] weeks gestation of pregnancy -Reviewed fetal movement and when to be concerned.  Instructed to not go more than 4 hours without regular movement.  -Discussed vaginal pain/pressure.  Reassured that normal to experience during pregnancy.   2. Decreased fetal movement, third trimester, not applicable or unspecified fetus -NST performed and NR with BL of 140, moderate variability, +10x10 accels, No Decels.  -Instructed to report to MAU for further evaluation with potential for extended monitoring, BPP, and admission if necessary.       Meds: No orders of the defined types were placed in this encounter.  Labs/procedures today:  Lab Orders  No laboratory test(s) ordered today     Reviewed: Term labor symptoms and general obstetric precautions including but not limited to vaginal bleeding, contractions, leaking of fluid and fetal movement were reviewed in detail with the patient.  All questions were answered.  Follow-up: No follow-ups on file.  No orders of the defined types were placed in this encounter.  Cherre Robins MSN, CNM 06/03/2022

## 2022-06-03 NOTE — H&P (Cosign Needed Addendum)
OBSTETRIC ADMISSION HISTORY AND PHYSICAL  Brenda Mayo is a 27 y.o. female G3P1011 with IUP at [redacted]w[redacted]d by LMP presenting for IOL 2/2 decreased FM. She reports +FMs, No LOF, no VB, no blurry vision, headaches or peripheral edema, and RUQ pain.  She plans on breast feeding. She request Paraguard IUD for birth control. She received her prenatal care at  [facility]    Dating: By LMP --->  Estimated Date of Delivery: 06/24/22  Sono:    @[redacted]w[redacted]d , CWD, normal anatomy, vertex presentation, 694g, 40% EFW  Prenatal History/Complications:  -placenta previa in second trimester-resolved   Past Medical History: Past Medical History:  Diagnosis Date   Anemia    Sickle cell trait (HCC)    Past Surgical History: Past Surgical History:  Procedure Laterality Date   NO PAST SURGERIES     Obstetrical History: OB History     Gravida  3   Para  1   Term  1   Preterm      AB  1   Living  1      SAB  1   IAB      Ectopic      Multiple  0   Live Births  1          Social History Social History   Socioeconomic History   Marital status: Single    Spouse name: Not on file   Number of children: Not on file   Years of education: Not on file   Highest education level: Not on file  Occupational History   Not on file  Tobacco Use   Smoking status: Never   Smokeless tobacco: Never  Vaping Use   Vaping Use: Never used  Substance and Sexual Activity   Alcohol use: Never   Drug use: Never   Sexual activity: Yes    Birth control/protection: None  Other Topics Concern   Not on file  Social History Narrative   Not on file   Social Determinants of Health   Financial Resource Strain: Not on file  Food Insecurity: No Food Insecurity (06/03/2022)   Hunger Vital Sign    Worried About Running Out of Food in the Last Year: Never true    Ran Out of Food in the Last Year: Never true  Transportation Needs: No Transportation Needs (06/03/2022)   PRAPARE - Therapist, art (Medical): No    Lack of Transportation (Non-Medical): No  Physical Activity: Not on file  Stress: Not on file  Social Connections: Not on file   Family History: Family History  Problem Relation Age of Onset   Sickle cell trait Mother    Allergies: Allergies  Allergen Reactions   Penicillins     Reports as child, does not know the reaction type.    Tomato Hives   Medications Prior to Admission  Medication Sig Dispense Refill Last Dose   Prenatal Vit-Fe Fumarate-FA (PRENATAL VITAMINS PO) Take by mouth. Taking one a day   06/02/2022   Review of Systems   All systems reviewed and negative except as stated in HPI  Blood pressure (!) 130/98, pulse (!) 110, temperature 98.2 F (36.8 C), temperature source Oral, resp. rate 16, last menstrual period 09/17/2021. General appearance: alert and no distress Lungs: clear to auscultation bilaterally Heart: regular rate and rhythm Abdomen: soft, non-tender; bowel sounds normal Pelvic: deferred Extremities: Homans sign is negative, no sign of DVT DTR's +2 bilaterally Presentation: cephalic Fetal monitoring: Baseline: 135  bpm, Variability: Good {> 6 bpm), Accelerations: Reactive, and Decelerations: Absent Uterine activity None Dilation: 1 Effacement (%): 50 Station: -3 Exam by:: Baxter Hire, SNM   Prenatal labs: ABO, Rh: --/--/O POS (05/21 1632) Antibody: NEG (05/21 1632) Rubella: 7.77 (12/14 1038) RPR: Non Reactive (03/22 0932)  HBsAg: Negative (12/14 1038)  HIV: Non Reactive (03/22 0932)  GBS: Negative/-- (05/14 1216)          Nursing Staff Provider  Office Location [place] Dating  06/24/2022, by Last Menstrual Period  [facility] Model [ ]  Traditional [ ]  Centering [ ]  Mom-Baby Dyad Anatomy US     Language  English      Flu Vaccine  Declines  Genetic/Carrier Screen  NIPS:   LR female AFP:   negative Horizon: sickle cell carrier  TDaP Vaccine    Hgb A1C or  GTT Early  Third trimester   COVID Vaccine      LAB RESULTS   Rhogam  O/Positive/-- (12/14 1038)  Blood Type O/Positive/-- (12/14 1038)   Baby Feeding Plan Breast  Antibody Negative (12/14 1038)  Contraception   Rubella 7.77 (12/14 1038)  Circumcision No  RPR Non Reactive (03/22 0932)   Pediatrician  [facility]  HBsAg Negative (12/14 1038)   Support Person Aaron(FOB) HCVAb Non Reactive (12/14 1038)   Prenatal Classes   HIV Non Reactive (03/22 0932)     BTL Consent   GBS Negative/-- (05/14 1216) (For PCN allergy, check sensitivities)   VBAC Consent   Pap       Diagnosis  Date Value Ref Range Status  04/19/2019     Final    - Negative for intraepithelial lesion or malignancy (NILM)             DME Rx [ ]  BP cuff [ ]  Weight Scale Waterbirth  [ ]  Class [ ]  Consent [ ]  CNM visit  PHQ9 & GAD7 [  ] new OB [X]  28 weeks  [  ] 36 weeks Induction  [ ]  Orders Entered [ ] Foley Y/N     Prenatal Transfer Tool  Maternal Diabetes: No Genetic Screening: Normal Maternal Ultrasounds/Referrals: Normal Fetal Ultrasounds or other Referrals:  None Maternal Substance Abuse:  No Significant Maternal Medications:  None Significant Maternal Lab Results:  None Number of Prenatal Visits:greater than 3 verified prenatal visits Other Comments:  None  Results for orders placed or performed during the hospital encounter of 06/03/22 (from the past 24 hour(s))  Type and screen [facility]   Collection Time: 06/03/22  4:32 PM  Result Value Ref Range   ABO/RH(D) O POS    Antibody Screen NEG    Sample Expiration      06/06/2022,2359 Performed at [facility], [address], [place], [place] [address]   CBC   Collection Time: 06/03/22  4:35 PM  Result Value Ref Range   WBC 7.8 4.0 - 10.5 K/uL   RBC 4.29 3.87 - 5.11 MIL/uL   Hemoglobin 11.0 (L) 12.0 - 15.0 g/dL   HCT 60.4 54.0 - 98.1 %   MCV 83.9 80.0 - 100.0 fL   MCH 25.6 (L) 26.0 - 34.0 pg   MCHC 30.6 30.0 - 36.0 g/dL   RDW 19.1 (H) 47.8 - 29.5 %   Platelets 235 150 - 400  K/uL   nRBC 0.0 0.0 - 0.2 %    Patient Active Problem List   Diagnosis Date Noted   Indication for care in labor and delivery, antepartum  06/03/2022   Supervision of other normal pregnancy, antepartum 12/26/2021   Placenta previa in second trimester 12/26/2021    Assessment/Plan:  Charlyse Coro is a 27 y.o. G3P1011 at [redacted]w[redacted]d here for IOL per MFM for BPP 6/10 and decreased FM.  #Labor:IOL with cervical ripening, then proceed to AROM and pitocin as needed #Pain: Discussed options;plans to try nitrous oxide then epidural #FWB: Cat 1, moderate variability with accels, no decels #ID:  N/A #MOF: breast #MOC:Paraguard IUD #Circ:  N/A  Karis Juba, Student-MidWife  06/03/2022, 5:56 PM  I spoke with and examined patient and agree with resident/PA-S/MS/SNM's note and plan of care.  S: Admitted for IOL d/t no fetal movement x 2d, NRNST in office, BPP 6/8 (-2 for breathing) for total of 6/10, MFM recommended IOL.  Comfortable, no complaints. Wants to avoid foley bulb and pitocin if possible O: 130/84, HR 97 SVE on admit 1/50/-3 Cat 1 FHR, irregular UCs A: [redacted]w[redacted]d IOL for DFM x2d and BPP 6/10  P: Plan discussed w/ pt by previous CNM, dual cytotec, plan for AROM if able (pt trying to avoid foley bulb/pit if possible). Cheral Marker, CNM, [facility] 06/03/2022 7:02 PM

## 2022-06-03 NOTE — MAU Note (Addendum)
Brenda Mayo is a 27 y.o. at [redacted]w[redacted]d here in MAU reporting: being sent over from her OB office for DFM and failed NST. States the baby is moving some but not as much as usual. States she did eat before she arrived to MAU. States she has back pain that comes and goes and vaginal pain that is constant. States its worse when she is sitting straight up and has to go lay down. Denies any odor, itching or discolored discharge.  Sexual intercourse last night. Denies any LOF or VB.   Onset of complaint: today Pain score: 8/10-vagina  5/10-low back  Vitals:   06/03/22 1400 06/03/22 1403  BP: 136/77 (!) 147/84  Pulse: (!) 129 (!) 121  Resp: 17   Temp: 98.3 F (36.8 C)      FHT:161 Lab orders placed from triage:  US-BPP

## 2022-06-03 NOTE — MAU Provider Note (Signed)
History     CSN: [id number]  Arrival date and time: 06/03/22 1332   Event Date/Time   First Provider Initiated Contact with Patient 06/03/22 1404      Chief Complaint  Patient presents with   Decreased Fetal Movement   Back Pain   Brenda Mayo , a  27 y.o. G3P1011 at [redacted]w[redacted]d presents to MAU after being sent over from the office for decreased fetal movement and a non-reactive NST in the office today. Patient states that she got her hair braided 2 days ago and noted that she was not really feeling baby move. She states that yesterday she only felt her move prior to laying down. She states that she was reassured at that time and didn't think much about it anymore. She was recommended to eat on the way over to MAU and she reports that she did. She denies pain, vaginal bleeding leaking lf fluids and contractions.           OB History     Gravida  3   Para  1   Term  1   Preterm      AB  1   Living  1      SAB  1   IAB      Ectopic      Multiple  0   Live Births  1           Past Medical History:  Diagnosis Date   Anemia    Sickle cell trait (HCC)     Past Surgical History:  Procedure Laterality Date   NO PAST SURGERIES      Family History  Problem Relation Age of Onset   Sickle cell trait Mother     Social History   Tobacco Use   Smoking status: Never   Smokeless tobacco: Never  Vaping Use   Vaping Use: Never used  Substance Use Topics   Alcohol use: Never   Drug use: Never    Allergies:  Allergies  Allergen Reactions   Penicillins     Reports as child, does not know the reaction type.    Tomato Hives    Medications Prior to Admission  Medication Sig Dispense Refill Last Dose   Prenatal Vit-Fe Fumarate-FA (PRENATAL VITAMINS PO) Take by mouth. Taking one a day   06/02/2022    Review of Systems  Constitutional:  Negative for chills, fatigue and fever.  Eyes:  Negative for pain and visual disturbance.  Respiratory:   Negative for apnea, shortness of breath and wheezing.   Cardiovascular:  Negative for chest pain and palpitations.  Gastrointestinal:  Negative for abdominal pain, constipation, diarrhea, nausea and vomiting.  Genitourinary:  Negative for difficulty urinating, dysuria, pelvic pain, vaginal bleeding, vaginal discharge and vaginal pain.  Musculoskeletal:  Negative for back pain.  Neurological:  Negative for seizures, weakness and headaches.  Psychiatric/Behavioral:  Negative for suicidal ideas.    Physical Exam   Last menstrual period 09/17/2021.  Physical Exam Vitals and nursing note reviewed.  Constitutional:      General: She is not in acute distress.    Appearance: Normal appearance.  HENT:     Head: Normocephalic.  Pulmonary:     Effort: Pulmonary effort is normal.  Musculoskeletal:        General: Normal range of motion.     Cervical back: Normal range of motion.  Skin:    General: Skin is warm and dry.     Capillary Refill: Capillary  refill takes 2 to 3 seconds.  Neurological:     Mental Status: She is alert and oriented to person, place, and time.  Psychiatric:        Mood and Affect: Mood normal.    FHT: 155 minimal variability. 1 10x10 accel no decels. Cat II  Toco: quiet   MAU Course  Procedures Orders Placed This Encounter  Procedures   [place] MFM FETAL BPP WO NON STRESS   [place] MFM FETAL BPP WO NON STRESS  Result Date: 06/03/2022 ----------------------------------------------------------------------  OBSTETRICS REPORT                       (Signed Final 06/03/2022 03:28 pm) ---------------------------------------------------------------------- Patient Info  ID #:       [id number]                          D.O.B.:  06/21/1995 (26 yrs)  Name:       Brenda Mayo               Visit Date: 06/03/2022 03:00 pm              [place] ---------------------------------------------------------------------- Performed By  Attending:        Noralee Space MD        Secondary Phy.:    [facility] MAU/Triage  Performed By:     Marcellina Millin          [facility]  Referred By:      Carlynn Herald ---------------------------------------------------------------------- Orders  #  Description                           Code        Ordered By  1  [place] MFM FETAL BPP WO NON               40981.19    Jayton Popelka     STRESS                                            Alexzavier Girardin ----------------------------------------------------------------------  #  Order #                     Accession #                Episode #  1  [id number]                   [id number]                 [id number] ---------------------------------------------------------------------- Indications  Decreased fetal movement                       O36.8190  [redacted] weeks gestation of pregnancy  Z3A.37 ---------------------------------------------------------------------- Fetal Evaluation  Num Of Fetuses:         1  Fetal Heart Rate(bpm):  144  Cardiac Activity:       Observed  Presentation:           Cephalic  Placenta:               Anterior  Amniotic Fluid  AFI FV:      Within normal limits  AFI Sum(cm)     %Tile       Largest Pocket(cm)  11.6            36          4.  RUQ(cm)       RLQ(cm)       LUQ(cm)        LLQ(cm)  3.4           2.7           4              1.5 ---------------------------------------------------------------------- Biophysical Evaluation  Amniotic F.V:   Within normal limits       F. Tone:        Observed  F. Movement:    Not Observed               Score:          6/8  F. Breathing:   Observed ---------------------------------------------------------------------- OB History  Gravidity:    3         Term:   1        Prem:   0        SAB:   1  TOP:          0       Ectopic:  0        Living: 1 ---------------------------------------------------------------------- Gestational Age  LMP:           37w 0d         Date:  09/17/21                   EDD:   06/24/22  Best:          37w 0d     Det. By:  LMP  (09/17/21)          EDD:   06/24/22 ---------------------------------------------------------------------- Anatomy  Diaphragm:             Appears normal         Kidneys:                Appear normal  Stomach:               Appears normal, left   Bladder:                Appears normal                         sided ---------------------------------------------------------------------- Impression  Patient was evaluated for c/o decreased fetal movements .  Amniotic fluid is normal and good fetal activity is seen  .Antenatal testing is reassuring. BPP 8/8. Cephalic  presentation. ----------------------------------------------------------------------                 Noralee Space, MD Electronically Signed Final Report   06/03/2022 03:28 pm ----------------------------------------------------------------------   MDM - initial NST 0/2  - PreLim report 2 off for movement all other categories WNL.   - BPP 6/8 for  a total of 6/10   - Recommendation for Admit with IOL at 37 weeks for 6/10 BPP and decreased fetal movement.  - Patient agreeable to plan of care.   Assessment and Plan  Admit to L&D for IOL for decreased fetal movement and 6/10 BPP.  - L&D provider notified.  - Report given to RN to arrange admission   Claudette Head, MSN CNM  06/03/2022, 2:04 PM

## 2022-06-04 ENCOUNTER — Encounter (HOSPITAL_COMMUNITY): Payer: Self-pay | Admitting: Obstetrics and Gynecology

## 2022-06-04 ENCOUNTER — Inpatient Hospital Stay (HOSPITAL_COMMUNITY): Payer: Medicaid Other | Admitting: Anesthesiology

## 2022-06-04 DIAGNOSIS — Z3A37 37 weeks gestation of pregnancy: Secondary | ICD-10-CM

## 2022-06-04 DIAGNOSIS — Z30430 Encounter for insertion of intrauterine contraceptive device: Secondary | ICD-10-CM

## 2022-06-04 DIAGNOSIS — Z975 Presence of (intrauterine) contraceptive device: Secondary | ICD-10-CM

## 2022-06-04 DIAGNOSIS — O368130 Decreased fetal movements, third trimester, not applicable or unspecified: Secondary | ICD-10-CM

## 2022-06-04 LAB — RPR: RPR Ser Ql: NONREACTIVE

## 2022-06-04 MED ORDER — LIDOCAINE HCL (PF) 1 % IJ SOLN
INTRAMUSCULAR | Status: DC | PRN
  Administered 2022-06-04: 5 mL via EPIDURAL
  Administered 2022-06-04: 3 mL via EPIDURAL

## 2022-06-04 MED ORDER — DIPHENHYDRAMINE HCL 25 MG PO CAPS: 25.0000 mg | ORAL_CAPSULE | Freq: Four times a day (QID) | ORAL | Status: DC | PRN

## 2022-06-04 MED ORDER — IBUPROFEN 600 MG PO TABS
600.0000 mg | ORAL_TABLET | Freq: Four times a day (QID) | ORAL | Status: DC
  Administered 2022-06-04 – 2022-06-05 (×4): 600 mg via ORAL
  Filled 2022-06-04 (×4): qty 1

## 2022-06-04 MED ORDER — SENNOSIDES-DOCUSATE SODIUM 8.6-50 MG PO TABS
2.0000 | ORAL_TABLET | ORAL | Status: DC
  Administered 2022-06-04: 2 via ORAL
  Filled 2022-06-04: qty 2

## 2022-06-04 MED ORDER — MAGNESIUM HYDROXIDE 400 MG/5ML PO SUSP: 30.0000 mL | ORAL | Status: DC | PRN

## 2022-06-04 MED ORDER — OXYTOCIN-SODIUM CHLORIDE 30-0.9 UT/500ML-% IV SOLN
1.0000 m[IU]/min | INTRAVENOUS | Status: DC
  Administered 2022-06-04: 2 m[IU]/min via INTRAVENOUS

## 2022-06-04 MED ORDER — DIBUCAINE (PERIANAL) 1 % EX OINT: 1.0000 | TOPICAL_OINTMENT | CUTANEOUS | Status: DC | PRN

## 2022-06-04 MED ORDER — OXYCODONE HCL 5 MG PO TABS: 5.0000 mg | ORAL_TABLET | ORAL | Status: DC | PRN

## 2022-06-04 MED ORDER — OXYCODONE HCL 5 MG PO TABS: 10.0000 mg | ORAL_TABLET | ORAL | Status: DC | PRN

## 2022-06-04 MED ORDER — BENZOCAINE-MENTHOL 20-0.5 % EX AERO
1.0000 | INHALATION_SPRAY | CUTANEOUS | Status: DC | PRN
  Filled 2022-06-04: qty 56

## 2022-06-04 MED ORDER — PRENATAL MULTIVITAMIN CH
1.0000 | ORAL_TABLET | Freq: Every day | ORAL | Status: DC
  Administered 2022-06-05: 1 via ORAL
  Filled 2022-06-04: qty 1

## 2022-06-04 MED ORDER — WITCH HAZEL-GLYCERIN EX PADS: 1.0000 | MEDICATED_PAD | CUTANEOUS | Status: DC | PRN

## 2022-06-04 MED ORDER — ONDANSETRON HCL 4 MG/2ML IJ SOLN: 4.0000 mg | INTRAMUSCULAR | Status: DC | PRN

## 2022-06-04 MED ORDER — TERBUTALINE SULFATE 1 MG/ML IJ SOLN: 0.2500 mg | Freq: Once | INTRAMUSCULAR | Status: DC | PRN

## 2022-06-04 MED ORDER — ONDANSETRON HCL 4 MG PO TABS: 4.0000 mg | ORAL_TABLET | ORAL | Status: DC | PRN

## 2022-06-04 MED ORDER — COCONUT OIL OIL
1.0000 | TOPICAL_OIL | Status: DC | PRN
  Administered 2022-06-05: 1 via TOPICAL

## 2022-06-04 MED ORDER — SIMETHICONE 80 MG PO CHEW: 80.0000 mg | CHEWABLE_TABLET | ORAL | Status: DC | PRN

## 2022-06-04 MED ORDER — ACETAMINOPHEN 325 MG PO TABS
650.0000 mg | ORAL_TABLET | ORAL | Status: DC | PRN
  Administered 2022-06-05: 650 mg via ORAL
  Filled 2022-06-04 (×2): qty 2

## 2022-06-04 NOTE — Progress Notes (Signed)
Patient ID: Brenda Mayo, female   DOB: April 07, 1995, 27 y.o.   MRN: [id number] LABOR NOTE Brenda Mayo is a 27 y.o. G3P1011 at [redacted]w[redacted]d admitted for induction of labor due to DFM x 2d and BPP 6/10  Subjective: Comfortable w/ epidural  Objective: 98.1, 76, 18, 120/62 FHR baseline 145 bpm, Variability: moderate, Accelerations:present, Decelerations:  Absent Toco: q 1-3 mins   SVE:   Dilation: 4 Effacement (%): 50 Station: -3 Exam by:: Boone Master, RN  Foley bulb out  Labs: Lab Results  Component Value Date   WBC 7.8 06/03/2022   HGB 11.0 (L) 06/03/2022   HCT 36.0 06/03/2022   MCV 83.9 06/03/2022   PLT 235 06/03/2022    Assessment / Plan: IOL d/t DFM x2d, BPP 6/10, s/p cytotec x 2 (50/25, then 25 vag at  2230), foley bulb now out. Contracting regularly and uncomfortable enough she got epidural, plan AROM when vtx lower. Wanting to avoid pitocin if possible  Labor: s/p cervical ripening Fetal Wellbeing:  Category I Pain Control:  epidural Pre-eclampsia: N/A I/D:  GBS neg Anticipated MOD: NSVB  Cheral Marker CNM, WHNP-BC 06/04/2022, [phone]

## 2022-06-04 NOTE — Discharge Summary (Signed)
Postpartum Discharge Summary  Date of Service updated***     Patient Name: Brenda Mayo DOB: 1995/03/22 MRN: [id number]  Date of admission: 06/03/2022 Delivery date:06/04/2022  Delivering provider: Venora Maples  Date of discharge: 06/04/2022  Admitting diagnosis: Indication for care in labor and delivery, antepartum [O75.9] Intrauterine pregnancy: [redacted]w[redacted]d     Secondary diagnosis:  Principal Problem:   Indication for care in labor and delivery, antepartum Active Problems:   IUD (intrauterine device) in place  Additional problems: ***    Discharge diagnosis: Term Pregnancy Delivered                                              Post partum procedures: post placental Paragard IUD insertion Augmentation: AROM, Pitocin, Cytotec, and IP Foley Complications: {OB Labor/Delivery Complications:20784}  Hospital course: Induction of Labor With Vaginal Delivery   27 y.o. yo G3P1011 at [redacted]w[redacted]d was admitted to the hospital 06/03/2022 for induction of labor.  Indication for induction:  BPP 6/10 and persistent decreased fetal movement .  Patient had an labor course that was uncomplicated. Membrane Rupture Time/Date: 1:41 PM ,06/04/2022   Delivery Method:Vaginal, Spontaneous  Episiotomy: None  Lacerations:  None  Details of delivery can be found in separate delivery note.  Patient had a postpartum course complicated by***. Patient is discharged home 06/04/22.  Newborn Data: Birth date:06/04/2022  Birth time:4:54 PM  Gender:Female  Living status:Living  Apgars:9 ,9  Weight:   Magnesium Sulfate received: No BMZ received: No Rhophylac:N/A MMR:N/A T-DaP:{Tdap:23962} Flu: Decline Transfusion:{Transfusion received:30440034}  Physical exam  Vitals:   06/04/22 1605 06/04/22 1630 06/04/22 1700 06/04/22 1730  BP: 91/69 113/65 131/65 124/79  Pulse: (!) 104 99 95 90  Resp:      Temp:      TempSrc:       General: {Exam; general:21111117} Lochia: {Desc;  appropriate/inappropriate:30686::"appropriate"} Uterine Fundus: {Desc; firm/soft:30687} Incision: {Exam; incision:21111123} DVT Evaluation: {Exam; dvt:2111122} Labs: Lab Results  Component Value Date   WBC 7.8 06/03/2022   HGB 11.0 (L) 06/03/2022   HCT 36.0 06/03/2022   MCV 83.9 06/03/2022   PLT 235 06/03/2022       No data to display         Edinburgh Score:    09/26/2019   10:18 AM  Edinburgh Postnatal Depression Scale Screening Tool  I have been able to laugh and see the funny side of things. 0  I have looked forward with enjoyment to things. 0  I have blamed myself unnecessarily when things went wrong. 0  I have been anxious or worried for no good reason. 2  I have felt scared or panicky for no good reason. 1  Things have been getting on top of me. 0  I have been so unhappy that I have had difficulty sleeping. 0  I have felt sad or miserable. 0  I have been so unhappy that I have been crying. 0  The thought of harming myself has occurred to me. 0  Edinburgh Postnatal Depression Scale Total 3     After visit meds:  Allergies as of 06/04/2022       Reactions   Penicillins    Reports as child, does not know the reaction type.    Tomato Hives     Med Rec must be completed prior to using this [facility]***        Discharge  home in stable condition Infant Feeding: Breast Infant Disposition:home with mother Discharge instruction: per After Visit Summary and Postpartum booklet. Activity: Advance as tolerated. Pelvic rest for 6 weeks.  Diet: routine diet Future Appointments: Future Appointments  Date Time Provider Department Center  06/12/2022 11:15 AM Levie Heritage, DO [facility] None   Follow up Visit:   Please schedule this patient for a In person postpartum visit in 4 weeks with the following provider: Any provider. Additional Postpartum F/U:BP check 1 week and IUD string check at [facility] visit   Low risk pregnancy complicated by: HTN Delivery mode:   Vaginal, Spontaneous  Anticipated Birth Control:  PP IUD placed   06/04/2022 Venora Maples, MD

## 2022-06-04 NOTE — Lactation Note (Signed)
This note was copied from a baby's chart. Lactation Consultation Note  Patient Name: Girl Alona Gunnells YNWGN'F Date: 06/04/2022 Age:28 hours  Birth Parent declined LC services in L&D.   Maternal Data    Feeding Nipple Type: Slow - flow (yellow)  LATCH Score                    Lactation Tools Discussed/Used    Interventions    Discharge    Consult Status      Frederico Hamman 06/04/2022, 7:31 PM

## 2022-06-04 NOTE — Progress Notes (Signed)
Patient ID: Brenda Mayo, female   DOB: March 24, 1995, 27 y.o.   MRN: [id number] LABOR NOTE Brenda Mayo is a 27 y.o. G3P1011 at [redacted]w[redacted]d admitted for induction of labor due to decreased fetal movement at term and BPP 6/10  Subjective: no complaints and comfortable with epidural  Objective: BP 108/63   Pulse 87   Temp 98.1 F (36.7 C)   Resp 16   LMP 09/17/2021 (Within Days)  No intake/output data recorded.  FHR baseline 125 bpm, Variability: moderate, Accelerations:present, Decelerations:  Absent Toco: q 2-4 mins   SVE:   Dilation: 5 Effacement (%): 50 Station: -2, -3 Exam by:: Preet Mangano, CNM  Labs: Lab Results  Component Value Date   WBC 7.8 06/03/2022   HGB 11.0 (L) 06/03/2022   HCT 36.0 06/03/2022   MCV 83.9 06/03/2022   PLT 235 06/03/2022    Assessment / Plan: IOL d/t DFM, BPP 6/10, s/p cytotec x 2 (50/25, 25- last dose 2230), foley bulb out ~0130, was having uncomfortable regular contractions, got epidural and wanted to see what she did on her own. Slightly more dilated, offered AROM as she has been wanting to avoid pit. Reviewed risks/benefits of AROM vs pit, wants to start w/ pit.   Labor: early Fetal Wellbeing:  Category I Pain Control:  epidural Pre-eclampsia: N/A I/D:  GBS neg Anticipated MOD: NSVB  Cheral Marker CNM, WHNP-BC 06/04/2022, 7:25 AM

## 2022-06-04 NOTE — Progress Notes (Signed)
Labor Progress Note Timmi Garlick is a 27 y.o. G3P1011 at [redacted]w[redacted]d presented for IOL 2/2 DFM, BPP 6/10.   S: No acute concerns.   O:  BP 135/73   Pulse 100   Temp 98 F (36.7 C) (Oral)   Resp 16   LMP 09/17/2021 (Within Days)  EFM: 135bpm/moderate/+accels, no decels  CVE: Dilation: 5 Effacement (%): 50 Cervical Position: Posterior Station: -2, -3 Presentation: Vertex Exam by:: Autry-Lott  [name]: 27 y.o. G3P1011 [redacted]w[redacted]d here DFM, BPP 6/10.  #Labor: s/p AROM clear. Continue pit.  #Pain: Epidural #FWB: Cat I  #GBS negative  Lynnda Wiersma Autry-Lott, DO 1:52 PM

## 2022-06-04 NOTE — Anesthesia Preprocedure Evaluation (Signed)
Anesthesia Evaluation  Patient identified by MRN, date of birth, ID band Patient awake    Reviewed: Allergy & Precautions, NPO status , Patient's Chart, lab work & pertinent test results  History of Anesthesia Complications Negative for: history of anesthetic complications  Airway Mallampati: III  TM Distance: >3 FB Neck ROM: Full    Dental   Pulmonary neg pulmonary ROS   Pulmonary exam normal breath sounds clear to auscultation       Cardiovascular negative cardio ROS  Rhythm:Regular Rate:Normal     Neuro/Psych negative neurological ROS     GI/Hepatic negative GI ROS, Neg liver ROS,,,  Endo/Other  negative endocrine ROS    Renal/GU negative Renal ROS     Musculoskeletal   Abdominal  (+) + obese  Peds  Hematology  (+) Blood dyscrasia, Sickle cell trait and anemia   Anesthesia Other Findings   Reproductive/Obstetrics (+) Pregnancy                             Anesthesia Physical Anesthesia Plan  ASA: 2  Anesthesia Plan: Epidural   Post-op Pain Management:    Induction:   PONV Risk Score and Plan:   Airway Management Planned:   Additional Equipment:   Intra-op Plan:   Post-operative Plan:   Informed Consent: I have reviewed the patients History and Physical, chart, labs and discussed the procedure including the risks, benefits and alternatives for the proposed anesthesia with the patient or authorized representative who has indicated his/her understanding and acceptance.       Plan Discussed with: Anesthesiologist  Anesthesia Plan Comments: (I have discussed risks of neuraxial anesthesia including but not limited to infection, bleeding, nerve injury, back pain, headache, seizures, and failure of block. Patient denies bleeding disorders and is not currently anticoagulated. Labs have been reviewed. Risks and benefits discussed. All patient's questions answered.  )        Anesthesia Quick Evaluation

## 2022-06-04 NOTE — Progress Notes (Signed)
Labor Progress Note Brenda Mayo is a 27 y.o. G3P1011 at [redacted]w[redacted]d presented for IOL 2/2 DFM, BPP 6/10.   S: Feeling nauseous. Planning to eat form light labor diet  O:  BP 135/73   Pulse 100   Temp 98 F (36.7 C) (Oral)   Resp 16   LMP 09/17/2021 (Within Days)  EFM: 130bpm/moderate/+accels, no decels  CVE: Dilation: 5 Effacement (%): 50 Cervical Position: Posterior Station: -2, -3 Presentation: Vertex Exam by:: Booker, CNM  [name]: 27 y.o. G3P1011 [redacted]w[redacted]d here DFM, BPP 6/10.  #Labor: Plan to AROM at next cervical exam. On 4 of pit.  #Pain: Epidural #FWB: Cat I  #GBS negative  Ahmeer Tuman Autry-Lott, DO 1:45 PM

## 2022-06-04 NOTE — Anesthesia Procedure Notes (Signed)
Epidural Patient location during procedure: OB Start time: 06/04/2022 2:55 AM End time: 06/04/2022 3:02 AM  Staffing Anesthesiologist: Linton Rump, MD Performed: anesthesiologist   Preanesthetic Checklist Completed: patient identified, IV checked, site marked, risks and benefits discussed, surgical consent, monitors and equipment checked, pre-op evaluation and timeout performed  Epidural Patient position: sitting Prep: DuraPrep and site prepped and draped Patient monitoring: continuous pulse ox and blood pressure Approach: midline Location: L3-L4 Injection technique: LOR saline  Needle:  Needle type: Tuohy  Needle gauge: 17 G Needle length: 9 cm and 9 Needle insertion depth: 6 cm Catheter type: closed end flexible Catheter size: 19 Gauge Catheter at skin depth: 10 cm Test dose: negative  Assessment Events: blood not aspirated, no cerebrospinal fluid, injection not painful, no injection resistance, no paresthesia and negative IV test  Additional Notes The patient has requested an epidural for labor pain management. Risks and benefits including, but not limited to, infection, bleeding, local anesthetic toxicity, headache, hypotension, back pain, block failure, etc. were discussed with the patient. The patient expressed understanding and consented to the procedure. I confirmed that the patient has no bleeding disorders and is not taking blood thinners. I confirmed the patient's last platelet count with the nurse. A time-out was performed immediately prior to the procedure. Please see nursing documentation for vital signs. Sterile technique was used throughout the whole procedure. Once LOR achieved, the epidural catheter threaded easily without resistance. Aspiration of the catheter was negative for blood and CSF. The epidural was dosed slowly and an infusion was started.  1 attempt(s)Reason for block:procedure for pain

## 2022-06-05 LAB — BIRTH TISSUE RECOVERY COLLECTION (PLACENTA DONATION)

## 2022-06-05 MED ORDER — IBUPROFEN 600 MG PO TABS: 600.0000 mg | ORAL_TABLET | Freq: Four times a day (QID) | ORAL | 0 refills | Status: AC

## 2022-06-05 MED ORDER — ACETAMINOPHEN 325 MG PO TABS: 650.0000 mg | ORAL_TABLET | ORAL | 0 refills | Status: AC | PRN

## 2022-06-05 NOTE — Anesthesia Postprocedure Evaluation (Signed)
Anesthesia Post Note  Patient: Brenda Mayo  Procedure(s) Performed: AN AD HOC LABOR EPIDURAL     Patient location during evaluation: Mother Baby Anesthesia Type: Epidural Level of consciousness: awake and alert Pain management: pain level controlled Vital Signs Assessment: post-procedure vital signs reviewed and stable Respiratory status: spontaneous breathing, nonlabored ventilation and respiratory function stable Cardiovascular status: stable Postop Assessment: no headache, no backache and epidural receding Anesthetic complications: no   No notable events documented.  Last Vitals:  Vitals:   06/05/22 0100 06/05/22 0514  BP: 118/67 (!) 110/58  Pulse: 91 75  Resp: 18 18  Temp: 36.6 C   SpO2: 99%     Last Pain:  Vitals:   06/05/22 0600  TempSrc:   PainSc: Asleep   Pain Goal:                   Salome Arnt

## 2022-06-06 ENCOUNTER — Encounter: Payer: Medicaid Other | Admitting: Obstetrics and Gynecology

## 2022-06-11 ENCOUNTER — Ambulatory Visit: Payer: Medicaid Other

## 2022-06-11 NOTE — Progress Notes (Signed)
Patient is eight day postpartum. Patient presents for BP Check. Patient states that she didn't get lactation services while in hospital due to they were trying to discharge home. Patient made aware I have placed referral with lactation at [facility] for women. [name] RN

## 2022-06-12 ENCOUNTER — Encounter: Payer: Medicaid Other | Admitting: Family Medicine

## 2022-06-13 ENCOUNTER — Encounter: Payer: Medicaid Other | Admitting: Obstetrics and Gynecology

## 2022-06-19 ENCOUNTER — Encounter: Payer: Self-pay | Admitting: Family Medicine

## 2022-06-19 DIAGNOSIS — M25551 Pain in right hip: Secondary | ICD-10-CM

## 2022-06-23 NOTE — Addendum Note (Signed)
Addended by: Levie Heritage on: 06/23/2022 04:07 PM   Modules accepted: Orders

## 2022-06-27 ENCOUNTER — Encounter (HOSPITAL_BASED_OUTPATIENT_CLINIC_OR_DEPARTMENT_OTHER): Payer: Self-pay

## 2022-06-27 ENCOUNTER — Ambulatory Visit (HOSPITAL_BASED_OUTPATIENT_CLINIC_OR_DEPARTMENT_OTHER)
Admission: RE | Admit: 2022-06-27 | Discharge: 2022-06-27 | Disposition: A | Discharge status: Home / Self Care | Payer: Medicaid Other | Source: Ambulatory Visit | Attending: Family Medicine | Admitting: Family Medicine

## 2022-06-27 DIAGNOSIS — M25551 Pain in right hip: Secondary | ICD-10-CM

## 2022-06-27 DIAGNOSIS — M25552 Pain in left hip: Secondary | ICD-10-CM | POA: Insufficient documentation

## 2022-07-01 ENCOUNTER — Encounter: Payer: Self-pay | Admitting: Family Medicine

## 2022-07-10 ENCOUNTER — Ambulatory Visit: Payer: Medicaid Other | Admitting: Family Medicine

## 2022-08-19 ENCOUNTER — Ambulatory Visit: Payer: Medicaid Other | Admitting: Medical

## 2023-01-30 ENCOUNTER — Ambulatory Visit: Payer: Medicaid Other | Admitting: Physician Assistant

## 2023-02-11 ENCOUNTER — Encounter: Payer: Self-pay | Admitting: Obstetrics & Gynecology

## 2023-02-11 ENCOUNTER — Ambulatory Visit (INDEPENDENT_AMBULATORY_CARE_PROVIDER_SITE_OTHER): Payer: Medicaid Other | Admitting: Obstetrics & Gynecology

## 2023-02-11 VITALS — BP 133/82 | HR 73 | Ht 70.0 in | Wt 248.0 lb

## 2023-02-11 DIAGNOSIS — Z3009 Encounter for other general counseling and advice on contraception: Secondary | ICD-10-CM

## 2023-02-11 DIAGNOSIS — Z30432 Encounter for removal of intrauterine contraceptive device: Secondary | ICD-10-CM

## 2023-02-11 MED ORDER — NORETHINDRONE ACET-ETHINYL EST 1.5-30 MG-MCG PO TABS
1.0000 | ORAL_TABLET | Freq: Every day | ORAL | 11 refills | Status: AC
Start: 2023-02-11 — End: ?

## 2023-02-11 NOTE — Patient Instructions (Signed)
Birth Control Pills (Oral Contraceptives): What to Know Oral contraceptive pills, or birth control pills, are medicines that prevent pregnancy. They work by: Preventing the ovaries from releasing eggs. Thickening mucus in the lower part of the uterus called the cervix. This prevents sperm from getting in the uterus. Thinning the lining of the uterus. This prevents a fertilized egg from attaching to the lining. Birth control pills are highly effective at preventing pregnancy when you take them exactly as told. Birth control pills do not prevent sexually transmitted infections (STIs). Use condoms while taking birth control pills to help prevent STIs. What happens before starting birth control pills? Before you start taking birth control pills: You may have a physical exam, blood test, and Pap test. Your health care provider will make sure it's OK for you to use birth control pills. Birth control pills are not a good option for: People who smoke and are older than age 3. People who have or have had certain conditions, such as: High blood pressure. Deep vein thrombosis. Blood clots in the lungs. Stroke. Heart disease or recent heart attack. Peripheral vascular disease. Blood clotting disorder or history of blood clots. Certain cancers. Diabetes. Gallbladder disease. High cholesterol. Kidney disease. Liver disease. Migraine headaches. Systemic lupus erythematosus (SLE). Unusual vaginal bleeding. Ask your provider about the possible side effects of the birth control pills. It can take 2-3 months for your body to adjust to changes in hormone levels. Types of birth control pills  Birth control pills have the hormones estrogen and progestin in them, or progestin only. The combination pill This type of pill contains estrogen and progestin hormones. These pills come in packs of 21 or 28 pills. Some packs with 28-day pills contain estrogen and progestin for the first 21-24 days. Hormone-free  pills, called inactive pills, are taken for the final 4-7 days. You should have menstrual bleeding during the time you take the inactive pills. In packs with 21 pills, you take no pills for the remaining 7 days in a 28-day period of time. Menstrual bleeding happens during these days. Some people prefer taking a pill for 28 days to help create a routine. Extended-interval contraception pills come in packs of 91 pills. The first 84 pills have both estrogen and progestin. The last 7 pills are inactive pills. Menstrual bleeding happens during the inactive pill days. With this schedule, menstrual bleeding happens once every 3 months. Continuous contraception pills come in packs of 28 pills. All pills in the pack contain estrogen and progestin. With this schedule, regular menstrual bleeding does not happen. But there may be spotting or irregular bleeding. Progestin-only pills This type of pill is often called the mini-pill and contains the progestin hormone only. It comes in packs of 28 pills. In some packs, the last 4 pills are inactive pills. You need to take this pill at the same time every day to prevent pregnancy. Menstrual bleeding may not be regular or predictable. What are the advantages? Birth control provides reliable and continuous contraception if taken as told. It may treat or decrease symptoms of: Menstrual period cramps, heavy menstrual flow, or bleeding from the uterus that's not normal. Irregular menstrual cycle or bleeding. Acne, depending on the type of pill. Polycystic ovarian syndrome (PCOS). Endometriosis. Iron deficiency anemia. Premenstrual symptoms, including very bad depression, anxiety, or getting annoyed very easily. It may also: Reduce the risk of endometrial and ovarian cancer. Be used as emergency contraception. Prevent ectopic pregnancies and infections of the fallopian tubes. What can  make birth control pills less effective? Birth control pills may be less effective  if: You forget to take the pill every day. Birth control pills may not work as well if you miss more than one pill. You may need to use a back-up contraceptive. For progestin-only pills, it's especially important to take the pill at the same time each day. Even taking it 3 hours late can increase the risk of pregnancy. You have a disease of the stomach or intestines that makes your body less able to absorb the pill. You take birth control pills with other medicines that make them less effective, such as antibiotics, certain HIV medicines, and some seizure medicines. You take expired pills. You forget to restart the pill after 7 days of not taking it. This applies to the packs of 21 pills and extended-interval packs of 91 pills. What are the side effects and risks? Birth control pills can sometimes cause side effects, such as: Headache. Depression. Trouble sleeping. Nausea and vomiting, bloating, or fluid retention. Breast tenderness. Irregular bleeding or spotting during the first several months. Increase in blood pressure. Gallbladder problems. Liver injury. Unusual vaginal discharge, itching, or smell. Sun sensitivity. Birth control pills with estrogen and progestin may slightly increase the risk of: Blood clots. Heart attack. Stroke. Follow these instructions at home: Follow instructions from your provider about how to start taking your first cycle of birth control pills. Depending on when you start the pill, you may need to use a backup form of birth control, such as condoms, during the first week. Make sure you know what steps to take if you forget to take a pill. If you think you're pregnant, stop taking birth control pills right away. Contact a health care provider if: If you think you're pregnant. This information is not intended to replace advice given to you by your health care provider. Make sure you discuss any questions you have with your health care provider. Document  Revised: 07/13/2022 Document Reviewed: 07/13/2022 Elsevier Patient Education  2024 [employer].

## 2023-02-11 NOTE — Progress Notes (Signed)
History:  28 y.o. Brenda Mayo here today for removal of Paragard IUD. She got it directly PP and reports heavy irreg bleeding, pain in her back and cramping. She does occ go to a [employer] but, no daily tob use.    The following portions of the patient's history were reviewed and updated as appropriate: allergies, current medications, past family history, past medical history, past social history, past surgical history and problem list.  Review of Systems:  Pertinent items are noted in HPI.    Objective:  Physical Exam Blood pressure 133/82, pulse 73, height 5\' 10"  (1.778 m), weight 248 lb (112.5 kg), last menstrual period 01/21/2023, not currently breastfeeding.  CONSTITUTIONAL: Well-developed, well-nourished female in no acute distress.  HENT:  Normocephalic, atraumatic EYES: Conjunctivae and EOM are normal. No scleral icterus.  NECK: Normal range of motion SKIN: Skin is warm and dry. No rash noted. Not diaphoretic.No pallor. NEUROLGIC: Alert and oriented to person, place, and time. Normal coordination.    GYN PROCEDURE: Patient was in the dorsal lithotomy position, normal external genitalia was noted.  A speculum was placed in the patient's vagina, normal discharge was noted, no lesions. The multiparous cervix was visualized, no lesions, no abnormal discharge;  and the cervix was swabbed with Betadine using scopettes. The strings of the IUD were grasped and pulled using ring forceps.  The IUD was successfully removed in its entirety.   Patient tolerated the procedure well.      Assessment & Plan:  Reviewed all forms of birth control options available including abstinence; over the counter/barrier methods; hormonal contraceptive medication including pill, patch, ring, injection,contraceptive implant; hormonal and nonhormonal IUDs; permanent sterilization options including vasectomy and the various tubal sterilization modalities. Risks and benefits reviewed.  Questions were answered.   Information was given to patient to review.   Pt opts for removal of IUD and to start OCPs. No contraindications.   Brenda Mayo was seen today for iud removal.  Diagnoses and all orders for this visit:  Encounter for counseling regarding contraception -     Norethindrone Acetate-Ethinyl Estradiol (LOESTRIN 1.5/30, 21,) 1.5-30 MG-MCG tablet; Take 1 tablet by mouth daily.  Pt instructed to use backup contraception for 2 weeks.   F/u in 3 months or sooner prn   Brenda Mayo, M.D., [place]
